# Patient Record
Sex: Female | Born: 1965 | Race: White | Hispanic: No | Marital: Married | Smoking: Never smoker
Health system: Southern US, Community
[De-identification: ages and names within clinical notes are randomized; demographics above are authoritative.]

## PROBLEM LIST (undated history)

## (undated) DIAGNOSIS — Z801 Family history of malignant neoplasm of trachea, bronchus and lung: Secondary | ICD-10-CM

## (undated) DIAGNOSIS — E079 Disorder of thyroid, unspecified: Secondary | ICD-10-CM

## (undated) DIAGNOSIS — R7303 Prediabetes: Secondary | ICD-10-CM

## (undated) DIAGNOSIS — E785 Hyperlipidemia, unspecified: Secondary | ICD-10-CM

## (undated) DIAGNOSIS — C50919 Malignant neoplasm of unspecified site of unspecified female breast: Secondary | ICD-10-CM

## (undated) DIAGNOSIS — R42 Dizziness and giddiness: Secondary | ICD-10-CM

## (undated) DIAGNOSIS — Z803 Family history of malignant neoplasm of breast: Secondary | ICD-10-CM

## (undated) DIAGNOSIS — C801 Malignant (primary) neoplasm, unspecified: Secondary | ICD-10-CM

## (undated) DIAGNOSIS — Z807 Family history of other malignant neoplasms of lymphoid, hematopoietic and related tissues: Secondary | ICD-10-CM

## (undated) DIAGNOSIS — G43909 Migraine, unspecified, not intractable, without status migrainosus: Secondary | ICD-10-CM

## (undated) DIAGNOSIS — Z9221 Personal history of antineoplastic chemotherapy: Secondary | ICD-10-CM

## (undated) DIAGNOSIS — Z923 Personal history of irradiation: Secondary | ICD-10-CM

## (undated) HISTORY — DX: Family history of other malignant neoplasms of lymphoid, hematopoietic and related tissues: Z80.7

## (undated) HISTORY — DX: Disorder of thyroid, unspecified: E07.9

## (undated) HISTORY — DX: Family history of malignant neoplasm of trachea, bronchus and lung: Z80.1

## (undated) HISTORY — PX: TONSILLECTOMY: SUR1361

## (undated) HISTORY — PX: NASAL SEPTUM SURGERY: SHX37

## (undated) HISTORY — DX: Prediabetes: R73.03

## (undated) HISTORY — PX: PORT-A-CATH REMOVAL: SHX5289

## (undated) HISTORY — DX: Hyperlipidemia, unspecified: E78.5

## (undated) HISTORY — PX: REDUCTION MAMMAPLASTY: SUR839

## (undated) HISTORY — DX: Migraine, unspecified, not intractable, without status migrainosus: G43.909

## (undated) HISTORY — PX: BREAST LUMPECTOMY: SHX2

## (undated) HISTORY — DX: Family history of malignant neoplasm of breast: Z80.3

## (undated) HISTORY — DX: Dizziness and giddiness: R42

## (undated) HISTORY — PX: BREAST SURGERY: SHX581

---

## 2004-02-26 ENCOUNTER — Other Ambulatory Visit: Admission: RE | Admit: 2004-02-26 | Discharge: 2004-02-26 | Payer: Self-pay | Admitting: Obstetrics and Gynecology

## 2004-03-09 ENCOUNTER — Encounter: Admission: RE | Admit: 2004-03-09 | Discharge: 2004-03-09 | Payer: Self-pay | Admitting: Obstetrics and Gynecology

## 2004-03-11 ENCOUNTER — Encounter: Admission: RE | Admit: 2004-03-11 | Discharge: 2004-03-11 | Payer: Self-pay | Admitting: Obstetrics and Gynecology

## 2004-09-06 ENCOUNTER — Encounter: Admission: RE | Admit: 2004-09-06 | Discharge: 2004-09-06 | Payer: Self-pay | Admitting: Obstetrics and Gynecology

## 2005-04-07 ENCOUNTER — Encounter: Admission: RE | Admit: 2005-04-07 | Discharge: 2005-04-07 | Payer: Self-pay | Admitting: Obstetrics and Gynecology

## 2005-04-07 ENCOUNTER — Other Ambulatory Visit: Admission: RE | Admit: 2005-04-07 | Discharge: 2005-04-07 | Payer: Self-pay | Admitting: Obstetrics and Gynecology

## 2006-05-04 ENCOUNTER — Encounter: Admission: RE | Admit: 2006-05-04 | Discharge: 2006-05-04 | Payer: Self-pay | Admitting: Obstetrics and Gynecology

## 2006-05-04 ENCOUNTER — Other Ambulatory Visit: Admission: RE | Admit: 2006-05-04 | Discharge: 2006-05-04 | Payer: Self-pay | Admitting: Obstetrics and Gynecology

## 2009-03-19 ENCOUNTER — Other Ambulatory Visit: Admission: RE | Admit: 2009-03-19 | Discharge: 2009-03-19 | Payer: Self-pay | Admitting: Obstetrics and Gynecology

## 2009-03-24 ENCOUNTER — Encounter: Admission: RE | Admit: 2009-03-24 | Discharge: 2009-03-24 | Payer: Self-pay | Admitting: Obstetrics and Gynecology

## 2009-03-30 ENCOUNTER — Encounter: Admission: RE | Admit: 2009-03-30 | Discharge: 2009-03-30 | Payer: Self-pay | Admitting: Obstetrics and Gynecology

## 2009-09-21 ENCOUNTER — Emergency Department: Payer: Self-pay | Admitting: Emergency Medicine

## 2010-02-28 ENCOUNTER — Encounter: Payer: Self-pay | Admitting: Internal Medicine

## 2010-05-17 ENCOUNTER — Other Ambulatory Visit: Admission: RE | Admit: 2010-05-17 | Discharge: 2010-05-17 | Payer: Self-pay | Admitting: Obstetrics and Gynecology

## 2010-05-25 ENCOUNTER — Encounter: Admission: RE | Admit: 2010-05-25 | Discharge: 2010-05-25 | Payer: Self-pay | Admitting: Obstetrics and Gynecology

## 2011-02-01 ENCOUNTER — Other Ambulatory Visit: Payer: Self-pay | Admitting: Obstetrics and Gynecology

## 2011-02-01 DIAGNOSIS — N6311 Unspecified lump in the right breast, upper outer quadrant: Secondary | ICD-10-CM

## 2011-02-06 ENCOUNTER — Ambulatory Visit
Admission: RE | Admit: 2011-02-06 | Discharge: 2011-02-06 | Disposition: A | Discharge status: Home / Self Care | Payer: BC Managed Care – PPO | Source: Ambulatory Visit | Attending: Obstetrics and Gynecology | Admitting: Obstetrics and Gynecology

## 2011-02-06 ENCOUNTER — Other Ambulatory Visit: Payer: Self-pay | Admitting: Obstetrics and Gynecology

## 2011-02-06 DIAGNOSIS — N6311 Unspecified lump in the right breast, upper outer quadrant: Secondary | ICD-10-CM

## 2011-02-06 DIAGNOSIS — N63 Unspecified lump in unspecified breast: Secondary | ICD-10-CM

## 2011-02-07 ENCOUNTER — Ambulatory Visit
Admission: RE | Admit: 2011-02-07 | Discharge: 2011-02-07 | Disposition: A | Discharge status: Home / Self Care | Payer: BC Managed Care – PPO | Source: Ambulatory Visit | Attending: Obstetrics and Gynecology | Admitting: Obstetrics and Gynecology

## 2011-02-07 ENCOUNTER — Other Ambulatory Visit: Payer: Self-pay | Admitting: Diagnostic Radiology

## 2011-02-07 ENCOUNTER — Other Ambulatory Visit: Payer: Self-pay | Admitting: Obstetrics and Gynecology

## 2011-02-07 DIAGNOSIS — N6311 Unspecified lump in the right breast, upper outer quadrant: Secondary | ICD-10-CM

## 2011-02-07 DIAGNOSIS — N6310 Unspecified lump in the right breast, unspecified quadrant: Secondary | ICD-10-CM

## 2011-02-07 DIAGNOSIS — N63 Unspecified lump in unspecified breast: Secondary | ICD-10-CM

## 2011-02-08 ENCOUNTER — Ambulatory Visit
Admission: RE | Admit: 2011-02-08 | Discharge: 2011-02-08 | Disposition: A | Discharge status: Home / Self Care | Payer: BC Managed Care – PPO | Source: Ambulatory Visit | Attending: Internal Medicine | Admitting: Internal Medicine

## 2011-02-08 ENCOUNTER — Other Ambulatory Visit: Payer: Self-pay | Admitting: Internal Medicine

## 2011-02-08 ENCOUNTER — Ambulatory Visit
Admission: RE | Admit: 2011-02-08 | Discharge: 2011-02-08 | Disposition: A | Discharge status: Home / Self Care | Payer: BC Managed Care – PPO | Source: Ambulatory Visit | Attending: Obstetrics and Gynecology | Admitting: Obstetrics and Gynecology

## 2011-02-08 DIAGNOSIS — N63 Unspecified lump in unspecified breast: Secondary | ICD-10-CM

## 2011-02-08 DIAGNOSIS — N6310 Unspecified lump in the right breast, unspecified quadrant: Secondary | ICD-10-CM

## 2011-02-08 DIAGNOSIS — C50911 Malignant neoplasm of unspecified site of right female breast: Secondary | ICD-10-CM

## 2011-02-09 ENCOUNTER — Other Ambulatory Visit: Payer: Self-pay | Admitting: Obstetrics and Gynecology

## 2011-02-09 DIAGNOSIS — C50911 Malignant neoplasm of unspecified site of right female breast: Secondary | ICD-10-CM

## 2011-02-10 ENCOUNTER — Other Ambulatory Visit: Payer: BC Managed Care – PPO

## 2011-02-10 ENCOUNTER — Ambulatory Visit
Admission: RE | Admit: 2011-02-10 | Discharge: 2011-02-10 | Disposition: A | Discharge status: Home / Self Care | Payer: BC Managed Care – PPO | Source: Ambulatory Visit | Attending: Internal Medicine | Admitting: Internal Medicine

## 2011-02-10 DIAGNOSIS — C50911 Malignant neoplasm of unspecified site of right female breast: Secondary | ICD-10-CM

## 2011-02-10 MED ORDER — GADOBENATE DIMEGLUMINE 529 MG/ML IV SOLN
15.0000 mL | Freq: Once | INTRAVENOUS | Status: AC | PRN
  Administered 2011-02-10: 15 mL via INTRAVENOUS

## 2011-02-15 ENCOUNTER — Other Ambulatory Visit: Payer: Self-pay | Admitting: Oncology

## 2011-02-15 ENCOUNTER — Encounter (HOSPITAL_BASED_OUTPATIENT_CLINIC_OR_DEPARTMENT_OTHER): Payer: BC Managed Care – PPO | Admitting: Oncology

## 2011-02-15 DIAGNOSIS — C50419 Malignant neoplasm of upper-outer quadrant of unspecified female breast: Secondary | ICD-10-CM

## 2011-02-15 DIAGNOSIS — C50919 Malignant neoplasm of unspecified site of unspecified female breast: Secondary | ICD-10-CM

## 2011-02-15 LAB — COMPREHENSIVE METABOLIC PANEL
Albumin: 4 g/dL (ref 3.5–5.2)
BUN: 11 mg/dL (ref 6–23)
CO2: 28 mEq/L (ref 19–32)
Glucose, Bld: 93 mg/dL (ref 70–99)
Sodium: 139 mEq/L (ref 135–145)
Total Bilirubin: 0.2 mg/dL — ABNORMAL LOW (ref 0.3–1.2)
Total Protein: 6.5 g/dL (ref 6.0–8.3)

## 2011-02-15 LAB — CBC WITH DIFFERENTIAL/PLATELET
Basophils Absolute: 0 10*3/uL (ref 0.0–0.1)
Eosinophils Absolute: 0.1 10*3/uL (ref 0.0–0.5)
HCT: 39.7 % (ref 34.8–46.6)
HGB: 13.4 g/dL (ref 11.6–15.9)
LYMPH%: 34 % (ref 14.0–49.7)
MCV: 90.2 fL (ref 79.5–101.0)
MONO#: 0.3 10*3/uL (ref 0.1–0.9)
NEUT#: 2.7 10*3/uL (ref 1.5–6.5)
Platelets: 180 10*3/uL (ref 145–400)
RBC: 4.4 10*6/uL (ref 3.70–5.45)
WBC: 4.8 10*3/uL (ref 3.9–10.3)

## 2011-02-15 LAB — CANCER ANTIGEN 27.29: CA 27.29: 18 U/mL (ref 0–39)

## 2011-02-17 ENCOUNTER — Encounter (HOSPITAL_COMMUNITY): Payer: Self-pay

## 2011-02-17 ENCOUNTER — Encounter (HOSPITAL_COMMUNITY)
Admission: RE | Admit: 2011-02-17 | Discharge: 2011-02-17 | Disposition: A | Discharge status: Home / Self Care | Payer: BC Managed Care – PPO | Source: Ambulatory Visit | Attending: Oncology | Admitting: Oncology

## 2011-02-17 DIAGNOSIS — C50919 Malignant neoplasm of unspecified site of unspecified female breast: Secondary | ICD-10-CM

## 2011-02-17 DIAGNOSIS — N831 Corpus luteum cyst of ovary, unspecified side: Secondary | ICD-10-CM | POA: Insufficient documentation

## 2011-02-17 DIAGNOSIS — Z79899 Other long term (current) drug therapy: Secondary | ICD-10-CM | POA: Insufficient documentation

## 2011-02-17 HISTORY — DX: Malignant (primary) neoplasm, unspecified: C80.1

## 2011-02-17 LAB — GLUCOSE, CAPILLARY: Glucose-Capillary: 97 mg/dL (ref 70–99)

## 2011-02-17 MED ORDER — FLUDEOXYGLUCOSE F - 18 (FDG) INJECTION
17.6000 | Freq: Once | INTRAVENOUS | Status: AC | PRN
  Administered 2011-02-17: 17.6 via INTRAVENOUS

## 2011-02-17 MED ORDER — IOHEXOL 300 MG/ML  SOLN
100.0000 mL | Freq: Once | INTRAMUSCULAR | Status: AC | PRN
  Administered 2011-02-17: 100 mL via INTRAVENOUS

## 2011-02-20 ENCOUNTER — Other Ambulatory Visit (INDEPENDENT_AMBULATORY_CARE_PROVIDER_SITE_OTHER): Payer: Self-pay | Admitting: General Surgery

## 2011-02-20 ENCOUNTER — Encounter: Payer: BC Managed Care – PPO | Admitting: Genetic Counselor

## 2011-02-20 DIAGNOSIS — C50911 Malignant neoplasm of unspecified site of right female breast: Secondary | ICD-10-CM

## 2011-02-22 ENCOUNTER — Ambulatory Visit (HOSPITAL_COMMUNITY)
Admission: RE | Admit: 2011-02-22 | Discharge: 2011-02-22 | Disposition: A | Discharge status: Home / Self Care | Payer: BC Managed Care – PPO | Source: Ambulatory Visit | Attending: Oncology | Admitting: Oncology

## 2011-02-22 DIAGNOSIS — I428 Other cardiomyopathies: Secondary | ICD-10-CM | POA: Insufficient documentation

## 2011-02-22 DIAGNOSIS — C50919 Malignant neoplasm of unspecified site of unspecified female breast: Secondary | ICD-10-CM | POA: Insufficient documentation

## 2011-02-22 DIAGNOSIS — Z5111 Encounter for antineoplastic chemotherapy: Secondary | ICD-10-CM

## 2011-02-28 ENCOUNTER — Ambulatory Visit (INDEPENDENT_AMBULATORY_CARE_PROVIDER_SITE_OTHER): Payer: BC Managed Care – PPO | Admitting: Psychiatry

## 2011-02-28 DIAGNOSIS — F0630 Mood disorder due to known physiological condition, unspecified: Secondary | ICD-10-CM

## 2011-03-02 ENCOUNTER — Ambulatory Visit (HOSPITAL_COMMUNITY): Payer: BC Managed Care – PPO

## 2011-03-02 ENCOUNTER — Ambulatory Visit
Admit: 2011-03-02 | Discharge: 2011-03-02 | Disposition: A | Discharge status: Home / Self Care | Payer: BC Managed Care – PPO | Attending: General Surgery | Admitting: General Surgery

## 2011-03-02 ENCOUNTER — Ambulatory Visit
Admission: RE | Admit: 2011-03-02 | Discharge: 2011-03-02 | Disposition: A | Discharge status: Home / Self Care | Payer: BC Managed Care – PPO | Source: Ambulatory Visit | Attending: General Surgery | Admitting: General Surgery

## 2011-03-02 ENCOUNTER — Ambulatory Visit (HOSPITAL_BASED_OUTPATIENT_CLINIC_OR_DEPARTMENT_OTHER)
Admission: RE | Admit: 2011-03-02 | Discharge: 2011-03-02 | Disposition: A | Discharge status: Home / Self Care | Payer: BC Managed Care – PPO | Source: Ambulatory Visit | Attending: General Surgery | Admitting: General Surgery

## 2011-03-02 ENCOUNTER — Other Ambulatory Visit (INDEPENDENT_AMBULATORY_CARE_PROVIDER_SITE_OTHER): Payer: Self-pay | Admitting: General Surgery

## 2011-03-02 DIAGNOSIS — C50911 Malignant neoplasm of unspecified site of right female breast: Secondary | ICD-10-CM

## 2011-03-02 DIAGNOSIS — C50919 Malignant neoplasm of unspecified site of unspecified female breast: Secondary | ICD-10-CM | POA: Insufficient documentation

## 2011-03-02 DIAGNOSIS — F341 Dysthymic disorder: Secondary | ICD-10-CM | POA: Insufficient documentation

## 2011-03-02 DIAGNOSIS — Z01812 Encounter for preprocedural laboratory examination: Secondary | ICD-10-CM | POA: Insufficient documentation

## 2011-03-02 LAB — POCT HEMOGLOBIN-HEMACUE: Hemoglobin: 13.5 g/dL (ref 12.0–15.0)

## 2011-03-02 MED ORDER — TECHNETIUM TC 99M SULFUR COLLOID FILTERED
1.0000 | Freq: Once | INTRAVENOUS | Status: AC | PRN
  Administered 2011-03-02: 1 via INTRADERMAL

## 2011-03-06 NOTE — Op Note (Signed)
NAME:  Renee Mcdaniel, SHARPNACK NO.:  [id number]  MEDICAL RECORD NO.:  [id number]  LOCATION:                                 FACILITY:  PHYSICIAN:  [name], MDDATE OF BIRTH:  [birth year]  DATE OF PROCEDURE:  03/02/2011 DATE OF DISCHARGE:                              OPERATIVE REPORT   PREOPERATIVE DIAGNOSIS:  Stage I right breast cancer, triple negative.  POSTOPERATIVE DIAGNOSIS:  Stage I right breast cancer, triple negative.  PROCEDURE: 1. Right breast wire-guided lumpectomy. 2. Injection of methylene blue dye for sentinel node identification. 3. Right axillary sentinel node biopsy. 4. Left subclavian 8-French power port insertion.  SURGEON:  Troy Sine. Dwain Sarna, MD  ASSISTANT:  None.  ANESTHESIA:  General.  SPECIMENS: 1. Right breast marked with paint kit. 2. Right axillary sentinel node x2.  DISPOSITION OF SPECIMENS:  Pathology.  ESTIMATED BLOOD LOSS:  Minimal.  COMPLICATIONS:  None. DRAINS:  None.  DISPOSITION:  The patient to PACU in stable condition.  INDICATIONS:  This is a 45 year old female with a newly diagnosed T2 right breast cancer with a benign intramammary lymph node who underwent evaluation with negative genetic testing.  She had a mildly enlarged node on her MRI, but this was not clinically palpable.  We discussed all of her options, and we have decided to do a right breast wire-guided lumpectomy with excision of the intramammary node as near the tumor as well as an axillary sentinel node biopsy with a possible completion axillary dissection for intraoperative pathology is positive combined with the Port-A-Cath due to the fact that she is triple negative, and this will be her primary systemic adjuvant therapy.  PROCEDURE:  After informed consent was obtained, the patient was first taken to the breast center.  She had two wires placed by Dr. Juel Burrow.  I discussed these prior to her having the wires placed, one was  an intramammary node, one was in the tumor.  She was then brought to [facility] surgery.  She was injected with technetium in a standard periareolar fashion.  She was then taken to the operating room.  She was administered 1 g of intravenous cefazolin.  Sequential compression devices were placed on lower extremities prior to induction with anesthesia.  She was then placed under general anesthesia without complication. A surgical time-out was then performed.  I then injected 1 mL of methylene blue saline mixture in each quadrant around the nipple, massaged if for 2 minutes.  She was then prepped and draped in the standard sterile surgical fashion.  I then made an axillary incision to approach the sentinel node first.  I dissected down.  I opened the axillary fascia.  I then was able to identify a node that appeared to be the 1.3 cm node that was seen on the MRI.  This was excised with a count of 289.  There was another packet of some nodes with an activity of about 100-120 that was also excised.  These were all passed off the table for intraoperative pathology.  Dr. Jimmy Picket confirmed that these were negative.  The background radioactivity was 0 following this.  There was no blue dye, no  other palpable nodes present. I packed this with a sponge.  I then approached the breast.  I made a radial incision in the upper outer quadrant, used cautery to excise the both wires, both lesions all the way down to the pectoralis muscle.  The deep margin is the pectoralis muscle, and the fascia was taken with this.  A portion of this was overlying the chest wall deep as well. There was really not any additional breast tissue in this location or lateral.  This was then passed off the table after being marked with a paint kit.  Mammogram confirmed removal of both lesions, both clips, and both wires, and appeared on mammogram the margins were all clear.  This was confirmed by Dr. Juel Burrow.  I then mobilized  the breast tissue.  I placed two clips deep on the pectoralis muscle and one clip in each cardinal position.  I then closed the deep breast tissue with a 2-0 Vicryl and then several layers with 3-0 Vicryl and 4-0 Monocryl.  The axillary incision was closed with 2-0 Vicryl for the axillary fascia, 3-0 Vicryl, and 4-0 Monocryl.  Dermabond and Steri-Strips were placed on all these incisions.  We then broke down this set completely, changed gowns, and proceeded to performing the Port-A-Cath placement on the left side.  She was tucked and appropriately padded.  She was then prepped and draped in a standard sterile surgical fashion.  Again, it was somewhat difficult for me to access her left subclavian vein and took several passes. Eventually, I was able to access her subclavian vein and this site, and placed the wire.  This was confirmed to be in position by fluoroscopy. I then made a pocket below this overlying the pectoralis fascia.  I then tunneled the line between the two,  the peel-away sheath was placed along with the dilator under direct vision.  The wire assembly was removed, the line was passed through, and the peel-away sheath was then removed.  The line was then pulled back to be near the cavoatrial junction.  This appeared to be in good position.  It was attached to the port, and this was secured in position with 2-0 Prolene and three places.  This flushed and aspirated blood.  It was packed with concentrated heparin.  This was closed with 3-0 Vicryl and 4- 0 Monocryl.  This was a 8-French power port.  Then, Dermabond was placed over this.  She tolerated all of this well, was extubated in the operating room, and transferred to the recovery room in stable condition.     [name], MD     MCW/MEDQ  D:  03/02/2011  T:  03/03/2011  Job:  [id number]  cc:   Pierce Crane, M.D., F.R.C.P.C. Lurline Hare, M.D. Daniel Nones, MD Jimmy Picket, MD  Electronically Signed by  Emelia Loron MD on 03/06/2011 04:21:47 PM

## 2011-03-08 ENCOUNTER — Ambulatory Visit
Admission: RE | Admit: 2011-03-08 | Discharge: 2011-03-08 | Disposition: A | Discharge status: Home / Self Care | Payer: BC Managed Care – PPO | Source: Ambulatory Visit | Attending: Radiation Oncology | Admitting: Radiation Oncology

## 2011-03-08 ENCOUNTER — Encounter (HOSPITAL_BASED_OUTPATIENT_CLINIC_OR_DEPARTMENT_OTHER): Payer: BC Managed Care – PPO | Admitting: Oncology

## 2011-03-08 DIAGNOSIS — C50419 Malignant neoplasm of upper-outer quadrant of unspecified female breast: Secondary | ICD-10-CM | POA: Insufficient documentation

## 2011-03-20 ENCOUNTER — Encounter: Payer: BC Managed Care – PPO | Admitting: Oncology

## 2011-03-24 ENCOUNTER — Ambulatory Visit (INDEPENDENT_AMBULATORY_CARE_PROVIDER_SITE_OTHER): Payer: BC Managed Care – PPO | Admitting: General Surgery

## 2011-03-24 ENCOUNTER — Encounter (INDEPENDENT_AMBULATORY_CARE_PROVIDER_SITE_OTHER): Payer: Self-pay | Admitting: General Surgery

## 2011-03-24 DIAGNOSIS — C50919 Malignant neoplasm of unspecified site of unspecified female breast: Secondary | ICD-10-CM

## 2011-03-24 DIAGNOSIS — C50411 Malignant neoplasm of upper-outer quadrant of right female breast: Secondary | ICD-10-CM | POA: Insufficient documentation

## 2011-03-24 DIAGNOSIS — Z09 Encounter for follow-up examination after completed treatment for conditions other than malignant neoplasm: Secondary | ICD-10-CM

## 2011-03-24 NOTE — Progress Notes (Signed)
Subjective:     Patient ID: Renee Mcdaniel, female   DOB: 08-15-1966, 45 y.o.   MRN: [id number]    There were no vitals taken for this visit.    HPI This is a 45 year old female who I initially met in May of 2012 at the multidisciplinary breast cancer conference. We discussed multiple options to take care of her newly diagnosed right breast cancer. We decided to perform a lumpectomy and excise an intramammary lymph node that was next to it as well as a sentinel lymph node biopsy. We also decided to place a porta same time given her triple negative status. Her genetic testing was negative. She underwent a lumpectomy, sentinel node biopsy, and left subclavian port placement. Her pathology returns as a T1 C. N0 stage I triple negative infiltrating ductal carcinoma. The specimen mammogram showed presence of both clips and both masses to be in the specimen. She comes back in today doing well without any significant complaints.  Review of Systems     Objective:   Physical Exam Well healing right upper outer quadrant breast incision and right axillary incision    Assessment:     Triple negative right breast stage I invasive ductal cancer s/p lumpectomy with negative margins and negative sentinel nodes    Plan:        She is doing really well after surgery. I released her to full activity. She due to begin her chemotherapy with Dr. Donnie Coffin on the 19th of this month. Following that she will need to undergo radiation therapy. I told her that I could try to remove her port between chemotherapy and radiation therapy of Dr. Donnie Coffin was agreeable to that. Otherwise I will plan on seeing her mother 2 after she finishes her radiation therapy.

## 2011-04-06 ENCOUNTER — Encounter (HOSPITAL_BASED_OUTPATIENT_CLINIC_OR_DEPARTMENT_OTHER): Payer: BC Managed Care – PPO | Admitting: Oncology

## 2011-04-06 ENCOUNTER — Other Ambulatory Visit: Payer: Self-pay | Admitting: Oncology

## 2011-04-06 DIAGNOSIS — Z5111 Encounter for antineoplastic chemotherapy: Secondary | ICD-10-CM

## 2011-04-06 DIAGNOSIS — C50419 Malignant neoplasm of upper-outer quadrant of unspecified female breast: Secondary | ICD-10-CM

## 2011-04-06 LAB — CBC WITH DIFFERENTIAL/PLATELET
BASO%: 0.3 % (ref 0.0–2.0)
LYMPH%: 25.3 % (ref 14.0–49.7)
MCHC: 34.1 g/dL (ref 31.5–36.0)
MONO#: 0.6 10*3/uL (ref 0.1–0.9)
MONO%: 7.3 % (ref 0.0–14.0)
Platelets: 190 10*3/uL (ref 145–400)
RBC: 4.36 10*6/uL (ref 3.70–5.45)
RDW: 12.9 % (ref 11.2–14.5)
WBC: 7.6 10*3/uL (ref 3.9–10.3)
nRBC: 0 % (ref 0–0)

## 2011-04-07 ENCOUNTER — Encounter (HOSPITAL_BASED_OUTPATIENT_CLINIC_OR_DEPARTMENT_OTHER): Payer: BC Managed Care – PPO | Admitting: Oncology

## 2011-04-07 DIAGNOSIS — Z5189 Encounter for other specified aftercare: Secondary | ICD-10-CM

## 2011-04-07 DIAGNOSIS — C50419 Malignant neoplasm of upper-outer quadrant of unspecified female breast: Secondary | ICD-10-CM

## 2011-04-12 ENCOUNTER — Other Ambulatory Visit: Payer: Self-pay | Admitting: Oncology

## 2011-04-12 ENCOUNTER — Encounter (HOSPITAL_BASED_OUTPATIENT_CLINIC_OR_DEPARTMENT_OTHER): Payer: BC Managed Care – PPO | Admitting: Oncology

## 2011-04-12 DIAGNOSIS — C50419 Malignant neoplasm of upper-outer quadrant of unspecified female breast: Secondary | ICD-10-CM

## 2011-04-12 LAB — CBC WITH DIFFERENTIAL/PLATELET
BASO%: 2.1 % — ABNORMAL HIGH (ref 0.0–2.0)
EOS%: 2.4 % (ref 0.0–7.0)
HGB: 12.6 g/dL (ref 11.6–15.9)
MCH: 29.4 pg (ref 25.1–34.0)
MCHC: 33.3 g/dL (ref 31.5–36.0)
MONO#: 0.7 10*3/uL (ref 0.1–0.9)
RDW: 13 % (ref 11.2–14.5)
WBC: 2.9 10*3/uL — ABNORMAL LOW (ref 3.9–10.3)
lymph#: 1.3 10*3/uL (ref 0.9–3.3)

## 2011-04-12 LAB — BASIC METABOLIC PANEL
Chloride: 102 mEq/L (ref 96–112)
Potassium: 4.4 mEq/L (ref 3.5–5.3)

## 2011-04-27 ENCOUNTER — Other Ambulatory Visit: Payer: Self-pay | Admitting: Physician Assistant

## 2011-04-27 ENCOUNTER — Encounter (HOSPITAL_BASED_OUTPATIENT_CLINIC_OR_DEPARTMENT_OTHER): Payer: BC Managed Care – PPO | Admitting: Oncology

## 2011-04-27 ENCOUNTER — Other Ambulatory Visit: Payer: Self-pay | Admitting: Oncology

## 2011-04-27 DIAGNOSIS — C50419 Malignant neoplasm of upper-outer quadrant of unspecified female breast: Secondary | ICD-10-CM

## 2011-04-27 DIAGNOSIS — Z5111 Encounter for antineoplastic chemotherapy: Secondary | ICD-10-CM

## 2011-04-27 LAB — COMPREHENSIVE METABOLIC PANEL
CO2: 25 mEq/L (ref 19–32)
Creatinine, Ser: 0.83 mg/dL (ref 0.50–1.10)
Glucose, Bld: 103 mg/dL — ABNORMAL HIGH (ref 70–99)
Sodium: 139 mEq/L (ref 135–145)
Total Bilirubin: 0.3 mg/dL (ref 0.3–1.2)
Total Protein: 5.9 g/dL — ABNORMAL LOW (ref 6.0–8.3)

## 2011-04-27 LAB — CBC WITH DIFFERENTIAL/PLATELET
Basophils Absolute: 0 10*3/uL (ref 0.0–0.1)
Eosinophils Absolute: 0 10*3/uL (ref 0.0–0.5)
HCT: 36.6 % (ref 34.8–46.6)
HGB: 12.1 g/dL (ref 11.6–15.9)
LYMPH%: 17.2 % (ref 14.0–49.7)
MCV: 90.1 fL (ref 79.5–101.0)
MONO%: 6.7 % (ref 0.0–14.0)
NEUT#: 7.2 10*3/uL — ABNORMAL HIGH (ref 1.5–6.5)
NEUT%: 75.8 % (ref 38.4–76.8)
Platelets: 318 10*3/uL (ref 145–400)

## 2011-04-28 ENCOUNTER — Encounter (HOSPITAL_BASED_OUTPATIENT_CLINIC_OR_DEPARTMENT_OTHER): Payer: BC Managed Care – PPO | Admitting: Oncology

## 2011-04-28 DIAGNOSIS — C50419 Malignant neoplasm of upper-outer quadrant of unspecified female breast: Secondary | ICD-10-CM

## 2011-04-28 DIAGNOSIS — Z5189 Encounter for other specified aftercare: Secondary | ICD-10-CM

## 2011-05-04 ENCOUNTER — Other Ambulatory Visit: Payer: Self-pay | Admitting: Physician Assistant

## 2011-05-04 ENCOUNTER — Encounter (HOSPITAL_BASED_OUTPATIENT_CLINIC_OR_DEPARTMENT_OTHER): Payer: BC Managed Care – PPO | Admitting: Oncology

## 2011-05-04 DIAGNOSIS — C50419 Malignant neoplasm of upper-outer quadrant of unspecified female breast: Secondary | ICD-10-CM

## 2011-05-04 LAB — CBC WITH DIFFERENTIAL/PLATELET
Eosinophils Absolute: 0.1 10*3/uL (ref 0.0–0.5)
LYMPH%: 30.4 % (ref 14.0–49.7)
MONO#: 0.9 10*3/uL (ref 0.1–0.9)
NEUT#: 2.3 10*3/uL (ref 1.5–6.5)
Platelets: 160 10*3/uL (ref 145–400)
RBC: 3.83 10*6/uL (ref 3.70–5.45)
RDW: 13.9 % (ref 11.2–14.5)
WBC: 4.8 10*3/uL (ref 3.9–10.3)

## 2011-05-18 ENCOUNTER — Other Ambulatory Visit: Payer: Self-pay | Admitting: Physician Assistant

## 2011-05-18 ENCOUNTER — Other Ambulatory Visit: Payer: Self-pay | Admitting: Oncology

## 2011-05-18 ENCOUNTER — Encounter (HOSPITAL_BASED_OUTPATIENT_CLINIC_OR_DEPARTMENT_OTHER): Payer: BC Managed Care – PPO | Admitting: Oncology

## 2011-05-18 DIAGNOSIS — Z5111 Encounter for antineoplastic chemotherapy: Secondary | ICD-10-CM

## 2011-05-18 DIAGNOSIS — C50419 Malignant neoplasm of upper-outer quadrant of unspecified female breast: Secondary | ICD-10-CM

## 2011-05-18 LAB — COMPREHENSIVE METABOLIC PANEL
CO2: 24 mEq/L (ref 19–32)
Calcium: 9.4 mg/dL (ref 8.4–10.5)
Creatinine, Ser: 0.75 mg/dL (ref 0.50–1.10)
Glucose, Bld: 111 mg/dL — ABNORMAL HIGH (ref 70–99)
Total Bilirubin: 0.3 mg/dL (ref 0.3–1.2)

## 2011-05-18 LAB — CBC WITH DIFFERENTIAL/PLATELET
Basophils Absolute: 0 10*3/uL (ref 0.0–0.1)
Eosinophils Absolute: 0 10*3/uL (ref 0.0–0.5)
HCT: 35 % (ref 34.8–46.6)
HGB: 11.6 g/dL (ref 11.6–15.9)
LYMPH%: 10.1 % — ABNORMAL LOW (ref 14.0–49.7)
MONO#: 0.2 10*3/uL (ref 0.1–0.9)
NEUT#: 6.5 10*3/uL (ref 1.5–6.5)
NEUT%: 86.7 % — ABNORMAL HIGH (ref 38.4–76.8)
Platelets: 195 10*3/uL (ref 145–400)
WBC: 7.5 10*3/uL (ref 3.9–10.3)

## 2011-05-19 ENCOUNTER — Encounter (HOSPITAL_BASED_OUTPATIENT_CLINIC_OR_DEPARTMENT_OTHER): Payer: BC Managed Care – PPO | Admitting: Oncology

## 2011-05-19 DIAGNOSIS — C50419 Malignant neoplasm of upper-outer quadrant of unspecified female breast: Secondary | ICD-10-CM

## 2011-05-19 DIAGNOSIS — Z5189 Encounter for other specified aftercare: Secondary | ICD-10-CM

## 2011-05-25 ENCOUNTER — Encounter (HOSPITAL_BASED_OUTPATIENT_CLINIC_OR_DEPARTMENT_OTHER): Payer: BC Managed Care – PPO | Admitting: Oncology

## 2011-05-25 ENCOUNTER — Other Ambulatory Visit: Payer: Self-pay | Admitting: Physician Assistant

## 2011-05-25 DIAGNOSIS — C50419 Malignant neoplasm of upper-outer quadrant of unspecified female breast: Secondary | ICD-10-CM

## 2011-05-25 LAB — CBC WITH DIFFERENTIAL/PLATELET
BASO%: 0.5 % (ref 0.0–2.0)
Eosinophils Absolute: 0.1 10*3/uL (ref 0.0–0.5)
HCT: 31 % — ABNORMAL LOW (ref 34.8–46.6)
LYMPH%: 21.5 % (ref 14.0–49.7)
MCHC: 34.3 g/dL (ref 31.5–36.0)
MCV: 90.5 fL (ref 79.5–101.0)
MONO#: 1.2 10*3/uL — ABNORMAL HIGH (ref 0.1–0.9)
MONO%: 16.5 % — ABNORMAL HIGH (ref 0.0–14.0)
NEUT%: 60.8 % (ref 38.4–76.8)
Platelets: 149 10*3/uL (ref 145–400)
RBC: 3.42 10*6/uL — ABNORMAL LOW (ref 3.70–5.45)
WBC: 7.1 10*3/uL (ref 3.9–10.3)

## 2011-05-30 ENCOUNTER — Other Ambulatory Visit: Payer: Self-pay | Admitting: Oncology

## 2011-05-30 DIAGNOSIS — C50919 Malignant neoplasm of unspecified site of unspecified female breast: Secondary | ICD-10-CM

## 2011-06-05 ENCOUNTER — Ambulatory Visit
Admission: RE | Admit: 2011-06-05 | Discharge: 2011-06-05 | Disposition: A | Discharge status: Home / Self Care | Payer: BC Managed Care – PPO | Source: Ambulatory Visit | Attending: Oncology | Admitting: Oncology

## 2011-06-05 DIAGNOSIS — C50919 Malignant neoplasm of unspecified site of unspecified female breast: Secondary | ICD-10-CM

## 2011-06-08 ENCOUNTER — Other Ambulatory Visit: Payer: Self-pay | Admitting: Oncology

## 2011-06-08 ENCOUNTER — Encounter (HOSPITAL_BASED_OUTPATIENT_CLINIC_OR_DEPARTMENT_OTHER): Payer: BC Managed Care – PPO | Admitting: Oncology

## 2011-06-08 DIAGNOSIS — C50419 Malignant neoplasm of upper-outer quadrant of unspecified female breast: Secondary | ICD-10-CM

## 2011-06-08 DIAGNOSIS — Z5111 Encounter for antineoplastic chemotherapy: Secondary | ICD-10-CM

## 2011-06-08 LAB — CBC WITH DIFFERENTIAL/PLATELET
BASO%: 0.2 % (ref 0.0–2.0)
EOS%: 0 % (ref 0.0–7.0)
HCT: 34.8 % (ref 34.8–46.6)
LYMPH%: 12.9 % — ABNORMAL LOW (ref 14.0–49.7)
MCH: 30.3 pg (ref 25.1–34.0)
MCHC: 33 g/dL (ref 31.5–36.0)
NEUT%: 84.4 % — ABNORMAL HIGH (ref 38.4–76.8)
Platelets: 246 10*3/uL (ref 145–400)
RBC: 3.8 10*6/uL (ref 3.70–5.45)

## 2011-06-08 LAB — COMPREHENSIVE METABOLIC PANEL
ALT: 23 U/L (ref 0–35)
AST: 17 U/L (ref 0–37)
Alkaline Phosphatase: 81 U/L (ref 39–117)
Creatinine, Ser: 0.76 mg/dL (ref 0.50–1.10)
Sodium: 139 mEq/L (ref 135–145)
Total Bilirubin: 0.3 mg/dL (ref 0.3–1.2)

## 2011-06-09 ENCOUNTER — Encounter (HOSPITAL_BASED_OUTPATIENT_CLINIC_OR_DEPARTMENT_OTHER): Payer: BC Managed Care – PPO | Admitting: Oncology

## 2011-06-09 DIAGNOSIS — Z5189 Encounter for other specified aftercare: Secondary | ICD-10-CM

## 2011-06-09 DIAGNOSIS — C50419 Malignant neoplasm of upper-outer quadrant of unspecified female breast: Secondary | ICD-10-CM

## 2011-06-28 ENCOUNTER — Encounter: Payer: BC Managed Care – PPO | Admitting: Oncology

## 2011-06-28 ENCOUNTER — Ambulatory Visit
Admission: RE | Admit: 2011-06-28 | Discharge: 2011-06-28 | Disposition: A | Discharge status: Home / Self Care | Payer: BC Managed Care – PPO | Source: Ambulatory Visit | Attending: Radiation Oncology | Admitting: Radiation Oncology

## 2011-06-28 ENCOUNTER — Ambulatory Visit: Payer: BC Managed Care – PPO | Admitting: Radiation Oncology

## 2011-06-28 DIAGNOSIS — C50419 Malignant neoplasm of upper-outer quadrant of unspecified female breast: Secondary | ICD-10-CM | POA: Insufficient documentation

## 2011-06-28 DIAGNOSIS — R0789 Other chest pain: Secondary | ICD-10-CM | POA: Insufficient documentation

## 2011-06-28 DIAGNOSIS — Z510 Encounter for antineoplastic radiation therapy: Secondary | ICD-10-CM | POA: Insufficient documentation

## 2011-06-28 DIAGNOSIS — M7989 Other specified soft tissue disorders: Secondary | ICD-10-CM | POA: Insufficient documentation

## 2011-06-28 LAB — BASIC METABOLIC PANEL
BUN: 14 mg/dL (ref 6–23)
CO2: 31 mEq/L (ref 19–32)
Calcium: 8.8 mg/dL (ref 8.4–10.5)
Creatinine, Ser: 0.76 mg/dL (ref 0.50–1.10)
Glucose, Bld: 98 mg/dL (ref 70–99)

## 2011-06-29 ENCOUNTER — Ambulatory Visit (HOSPITAL_BASED_OUTPATIENT_CLINIC_OR_DEPARTMENT_OTHER)
Admission: RE | Admit: 2011-06-29 | Discharge: 2011-06-29 | Disposition: A | Discharge status: Home / Self Care | Payer: BC Managed Care – PPO | Source: Ambulatory Visit | Attending: General Surgery | Admitting: General Surgery

## 2011-06-29 ENCOUNTER — Other Ambulatory Visit (INDEPENDENT_AMBULATORY_CARE_PROVIDER_SITE_OTHER): Payer: Self-pay | Admitting: General Surgery

## 2011-06-29 DIAGNOSIS — C50919 Malignant neoplasm of unspecified site of unspecified female breast: Secondary | ICD-10-CM | POA: Insufficient documentation

## 2011-06-29 DIAGNOSIS — Z853 Personal history of malignant neoplasm of breast: Secondary | ICD-10-CM

## 2011-06-29 DIAGNOSIS — Z452 Encounter for adjustment and management of vascular access device: Secondary | ICD-10-CM | POA: Insufficient documentation

## 2011-06-30 ENCOUNTER — Ambulatory Visit (HOSPITAL_COMMUNITY): Admission: RE | Admit: 2011-06-30 | Payer: BC Managed Care – PPO | Source: Ambulatory Visit | Admitting: General Surgery

## 2011-07-03 NOTE — Op Note (Signed)
  NAME:  Renee Mcdaniel, Renee Mcdaniel                ACCOUNT NO.:  [id number]  MEDICAL RECORD NO.:  [id number]  LOCATION:                                 FACILITY:  PHYSICIAN:  [name], MDDATE OF BIRTH:  [birth year]  DATE OF PROCEDURE:  06/29/2011 DATE OF DISCHARGE:                              OPERATIVE REPORT   PREOPERATIVE DIAGNOSIS:  Breast cancer, status post treatment.  POSTOPERATIVE DIAGNOSIS:  Breast cancer, status post treatment.  PROCEDURE:  Port removal.  SURGEON:  [name], MD.  ASSISTANT:  None.  ANESTHESIA:  Local MAC.  SPECIMENS:  Port.  ESTIMATED BLOOD LOSS:  Minimal.  COMPLICATIONS:  None.  DRAINS:  None.  DISPOSITION:  Recovery room in stable condition.  INDICATIONS:  This is a 45 year old female who has completed her treatment for breast cancer, who now presents for port removal.  We discussed the risks and benefits associated with that.  DESCRIPTION OF PROCEDURE:  After informed consent was obtained, the patient was taken to the operating room.  She was placed under monitored anesthesia care.  She was then prepped and draped in a standard sterile surgical fashion.  Surgical time-out was then performed.  I then infiltrated with 0.25% Marcaine throughout the area of her port. I then made an incision.  I removed the port in its entirety.  The hemostasis was observed.  I removed all the stitch material.  This was then closed with 3-0 Vicryl, 4-0 Monocryl, and Dermabond.  She tolerated this well, was transferred to recovery room in stable condition.     [name], MD     MCW/MEDQ  D:  06/29/2011  T:  06/29/2011  Job:  [id number]  cc:   Pierce Crane, M.D., F.R.C.P.C. Lurline Hare, M.D.  Electronically Signed by Emelia Loron MD on 07/03/2011 07:57:52 PM

## 2011-07-04 ENCOUNTER — Other Ambulatory Visit: Payer: Self-pay | Admitting: Oncology

## 2011-07-04 ENCOUNTER — Encounter (HOSPITAL_BASED_OUTPATIENT_CLINIC_OR_DEPARTMENT_OTHER): Payer: BC Managed Care – PPO | Admitting: Oncology

## 2011-07-04 DIAGNOSIS — Z5111 Encounter for antineoplastic chemotherapy: Secondary | ICD-10-CM

## 2011-07-04 DIAGNOSIS — C50419 Malignant neoplasm of upper-outer quadrant of unspecified female breast: Secondary | ICD-10-CM

## 2011-07-04 DIAGNOSIS — Z5189 Encounter for other specified aftercare: Secondary | ICD-10-CM

## 2011-07-04 LAB — CBC WITH DIFFERENTIAL/PLATELET
Basophils Absolute: 0 10*3/uL (ref 0.0–0.1)
Eosinophils Absolute: 0.1 10*3/uL (ref 0.0–0.5)
HCT: 32.1 % — ABNORMAL LOW (ref 34.8–46.6)
HGB: 11.1 g/dL — ABNORMAL LOW (ref 11.6–15.9)
MCV: 93.9 fL (ref 79.5–101.0)
MONO%: 11.8 % (ref 0.0–14.0)
NEUT#: 1.8 10*3/uL (ref 1.5–6.5)
NEUT%: 58 % (ref 38.4–76.8)
RDW: 18.2 % — ABNORMAL HIGH (ref 11.2–14.5)

## 2011-07-04 LAB — COMPREHENSIVE METABOLIC PANEL
Albumin: 3.1 g/dL — ABNORMAL LOW (ref 3.5–5.2)
Alkaline Phosphatase: 72 U/L (ref 39–117)
BUN: 8 mg/dL (ref 6–23)
Calcium: 8.5 mg/dL (ref 8.4–10.5)
Chloride: 106 mEq/L (ref 96–112)
Creatinine, Ser: 0.77 mg/dL (ref 0.50–1.10)
Glucose, Bld: 91 mg/dL (ref 70–99)
Potassium: 3.8 mEq/L (ref 3.5–5.3)

## 2011-07-04 LAB — CANCER ANTIGEN 27.29: CA 27.29: 22 U/mL (ref 0–39)

## 2011-07-13 ENCOUNTER — Ambulatory Visit: Payer: BC Managed Care – PPO | Attending: Oncology | Admitting: Physical Therapy

## 2011-07-13 DIAGNOSIS — I890 Lymphedema, not elsewhere classified: Secondary | ICD-10-CM | POA: Insufficient documentation

## 2011-07-13 DIAGNOSIS — IMO0001 Reserved for inherently not codable concepts without codable children: Secondary | ICD-10-CM | POA: Insufficient documentation

## 2011-07-13 DIAGNOSIS — M25619 Stiffness of unspecified shoulder, not elsewhere classified: Secondary | ICD-10-CM | POA: Insufficient documentation

## 2011-07-17 ENCOUNTER — Ambulatory Visit: Payer: BC Managed Care – PPO | Admitting: Physical Therapy

## 2011-07-19 ENCOUNTER — Ambulatory Visit: Payer: BC Managed Care – PPO | Admitting: Physical Therapy

## 2011-07-24 ENCOUNTER — Ambulatory Visit: Payer: BC Managed Care – PPO | Attending: Oncology | Admitting: Physical Therapy

## 2011-07-24 ENCOUNTER — Ambulatory Visit
Admission: RE | Admit: 2011-07-24 | Discharge: 2011-07-24 | Disposition: A | Discharge status: Home / Self Care | Payer: BC Managed Care – PPO | Source: Ambulatory Visit | Attending: Radiation Oncology | Admitting: Radiation Oncology

## 2011-07-24 ENCOUNTER — Encounter: Payer: BC Managed Care – PPO | Admitting: Physical Therapy

## 2011-07-24 DIAGNOSIS — IMO0001 Reserved for inherently not codable concepts without codable children: Secondary | ICD-10-CM | POA: Insufficient documentation

## 2011-07-24 DIAGNOSIS — I890 Lymphedema, not elsewhere classified: Secondary | ICD-10-CM | POA: Insufficient documentation

## 2011-07-24 DIAGNOSIS — M25619 Stiffness of unspecified shoulder, not elsewhere classified: Secondary | ICD-10-CM | POA: Insufficient documentation

## 2011-07-25 ENCOUNTER — Ambulatory Visit
Admission: RE | Admit: 2011-07-25 | Discharge: 2011-07-25 | Disposition: A | Discharge status: Home / Self Care | Payer: BC Managed Care – PPO | Source: Ambulatory Visit | Attending: Radiation Oncology | Admitting: Radiation Oncology

## 2011-07-25 VITALS — Wt 183.1 lb

## 2011-07-25 DIAGNOSIS — C50919 Malignant neoplasm of unspecified site of unspecified female breast: Secondary | ICD-10-CM

## 2011-07-25 NOTE — Progress Notes (Signed)
Weekly Management Note Current Dose: 25.2 Gy  Projected Dose: 61 Gy   Narrative:  The patient presents for routine under treatment assessment.  CBCT/MVCT images/Port film x-rays were reviewed.  The chart was checked. Looks good. Some inframammary soreness. Using sportsbra. Released by PT. Using lymphedema sleeve prn. Had mammogram in September with benign findings.  Physical Findings: Unchanged. No skin changes. Wt 183.  Impression:  The patient is tolerating radiation.  Plan:  Continue treatment as planned. Keep inframammary fold dry.

## 2011-07-26 ENCOUNTER — Ambulatory Visit
Admission: RE | Admit: 2011-07-26 | Discharge: 2011-07-26 | Disposition: A | Discharge status: Home / Self Care | Payer: BC Managed Care – PPO | Source: Ambulatory Visit | Attending: Radiation Oncology | Admitting: Radiation Oncology

## 2011-07-26 ENCOUNTER — Encounter: Payer: BC Managed Care – PPO | Admitting: Physical Therapy

## 2011-07-27 ENCOUNTER — Encounter: Payer: BC Managed Care – PPO | Admitting: Physical Therapy

## 2011-07-27 ENCOUNTER — Ambulatory Visit
Admission: RE | Admit: 2011-07-27 | Discharge: 2011-07-27 | Disposition: A | Discharge status: Home / Self Care | Payer: BC Managed Care – PPO | Source: Ambulatory Visit | Attending: Radiation Oncology | Admitting: Radiation Oncology

## 2011-07-28 ENCOUNTER — Ambulatory Visit
Admission: RE | Admit: 2011-07-28 | Discharge: 2011-07-28 | Disposition: A | Discharge status: Home / Self Care | Payer: BC Managed Care – PPO | Source: Ambulatory Visit | Attending: Radiation Oncology | Admitting: Radiation Oncology

## 2011-07-31 ENCOUNTER — Ambulatory Visit
Admission: RE | Admit: 2011-07-31 | Discharge: 2011-07-31 | Disposition: A | Discharge status: Home / Self Care | Payer: BC Managed Care – PPO | Source: Ambulatory Visit | Attending: Radiation Oncology | Admitting: Radiation Oncology

## 2011-07-31 ENCOUNTER — Encounter: Payer: BC Managed Care – PPO | Admitting: Physical Therapy

## 2011-08-01 ENCOUNTER — Ambulatory Visit
Admission: RE | Admit: 2011-08-01 | Discharge: 2011-08-01 | Disposition: A | Discharge status: Home / Self Care | Payer: BC Managed Care – PPO | Source: Ambulatory Visit | Attending: Radiation Oncology | Admitting: Radiation Oncology

## 2011-08-01 VITALS — Wt 184.4 lb

## 2011-08-01 DIAGNOSIS — C50419 Malignant neoplasm of upper-outer quadrant of unspecified female breast: Secondary | ICD-10-CM

## 2011-08-01 NOTE — Progress Notes (Signed)
Pt. Very uncomfortable , especially at bedtime. Using biafine for hyperpigmentation. Would like to try hydrogel pads for relief of skin on skin.

## 2011-08-02 ENCOUNTER — Encounter: Payer: BC Managed Care – PPO | Admitting: Physical Therapy

## 2011-08-02 ENCOUNTER — Ambulatory Visit
Admission: RE | Admit: 2011-08-02 | Discharge: 2011-08-02 | Disposition: A | Discharge status: Home / Self Care | Payer: BC Managed Care – PPO | Source: Ambulatory Visit | Attending: Radiation Oncology | Admitting: Radiation Oncology

## 2011-08-03 ENCOUNTER — Ambulatory Visit
Admission: RE | Admit: 2011-08-03 | Discharge: 2011-08-03 | Disposition: A | Discharge status: Home / Self Care | Payer: BC Managed Care – PPO | Source: Ambulatory Visit | Attending: Radiation Oncology | Admitting: Radiation Oncology

## 2011-08-04 ENCOUNTER — Ambulatory Visit
Admission: RE | Admit: 2011-08-04 | Discharge: 2011-08-04 | Disposition: A | Discharge status: Home / Self Care | Payer: BC Managed Care – PPO | Source: Ambulatory Visit | Attending: Radiation Oncology | Admitting: Radiation Oncology

## 2011-08-05 ENCOUNTER — Ambulatory Visit
Admission: RE | Admit: 2011-08-05 | Discharge: 2011-08-05 | Disposition: A | Discharge status: Home / Self Care | Payer: BC Managed Care – PPO | Source: Ambulatory Visit | Attending: Radiation Oncology | Admitting: Radiation Oncology

## 2011-08-07 ENCOUNTER — Ambulatory Visit
Admission: RE | Admit: 2011-08-07 | Discharge: 2011-08-07 | Disposition: A | Discharge status: Home / Self Care | Payer: BC Managed Care – PPO | Source: Ambulatory Visit | Attending: Radiation Oncology | Admitting: Radiation Oncology

## 2011-08-07 ENCOUNTER — Encounter: Payer: BC Managed Care – PPO | Admitting: Physical Therapy

## 2011-08-08 ENCOUNTER — Ambulatory Visit
Admission: RE | Admit: 2011-08-08 | Discharge: 2011-08-08 | Disposition: A | Discharge status: Home / Self Care | Payer: BC Managed Care – PPO | Source: Ambulatory Visit | Attending: Radiation Oncology | Admitting: Radiation Oncology

## 2011-08-08 ENCOUNTER — Ambulatory Visit: Payer: BC Managed Care – PPO | Admitting: Radiation Oncology

## 2011-08-08 DIAGNOSIS — C50419 Malignant neoplasm of upper-outer quadrant of unspecified female breast: Secondary | ICD-10-CM

## 2011-08-08 NOTE — Progress Notes (Addendum)
Weekly Management Note Current Dose: 45 Gy  Projected Dose: 61 Gy   Narrative:  The patient presents for routine under treatment assessment.  CBCT/MVCT images/Port film x-rays were reviewed.  The chart was checked. Has small amount of wet desquamation. Will apply neosporin and continue with hydrogel pads which have helped alleviate some of the discomfort. Has some itching will try if neosporin ineffective. No further lymphedema.   Physical Findings: Weight: 183 lb 11.2 oz (83.326 kg). Dry desquamation in inframammary fold. Tiny area of moist desquamation. Rest of breast is red.   Impression:  The patient is tolerating radiation.  Plan:  Continue treatment as planned. Continue biafene and hydrogel. Starts boost tomorrow.

## 2011-08-08 NOTE — Progress Notes (Signed)
Has small amount of wet desquamation. Will apply neosporin and continue with hydrogel pads which have helped alleviate Some of the discomfort. Has some itching will try if neosporin ineffective. Has completed 25 of 25 tx to right breast. Starts boost of 8 on 08/09/11.

## 2011-08-09 ENCOUNTER — Ambulatory Visit
Admission: RE | Admit: 2011-08-09 | Discharge: 2011-08-09 | Disposition: A | Discharge status: Home / Self Care | Payer: BC Managed Care – PPO | Source: Ambulatory Visit | Attending: Radiation Oncology | Admitting: Radiation Oncology

## 2011-08-09 ENCOUNTER — Encounter: Payer: BC Managed Care – PPO | Admitting: Physical Therapy

## 2011-08-11 ENCOUNTER — Ambulatory Visit: Payer: BC Managed Care – PPO

## 2011-08-14 ENCOUNTER — Encounter: Payer: BC Managed Care – PPO | Admitting: Physical Therapy

## 2011-08-14 ENCOUNTER — Ambulatory Visit
Admission: RE | Admit: 2011-08-14 | Discharge: 2011-08-14 | Disposition: A | Discharge status: Home / Self Care | Payer: BC Managed Care – PPO | Source: Ambulatory Visit | Attending: Radiation Oncology | Admitting: Radiation Oncology

## 2011-08-15 ENCOUNTER — Ambulatory Visit
Admission: RE | Admit: 2011-08-15 | Discharge: 2011-08-15 | Disposition: A | Discharge status: Home / Self Care | Payer: BC Managed Care – PPO | Source: Ambulatory Visit | Attending: Radiation Oncology | Admitting: Radiation Oncology

## 2011-08-15 DIAGNOSIS — C50419 Malignant neoplasm of upper-outer quadrant of unspecified female breast: Secondary | ICD-10-CM

## 2011-08-15 NOTE — Progress Notes (Signed)
Significant improvement in clearing of redness. Has moist desquamation under righ mammary fold. To continue with application of neosporin Bid to tid. And hydrogels. Has completed 3 of 8 tx of boost. Affect much brighter.decrease in pain.

## 2011-08-15 NOTE — Progress Notes (Signed)
Weekly Management Note Current Dose:   Gy  Projected Dose:  Gy   Narrative:  The patient presents for routine under treatment assessment.  CBCT/MVCT images/Port film x-rays were reviewed.  The chart was checked. Inframammary fold has healed up well. Still using neosporin. Biafene to rest of breast.  Physical Findings: Weight: 180 lb 11.2 oz (81.965 kg). Unchanged  Impression:  The patient is tolerating radiation.  Plan:  Continue treatment as planned. Continue neosporin/biafene. Last tx next week.

## 2011-08-16 ENCOUNTER — Encounter: Payer: BC Managed Care – PPO | Admitting: Physical Therapy

## 2011-08-16 ENCOUNTER — Ambulatory Visit
Admission: RE | Admit: 2011-08-16 | Discharge: 2011-08-16 | Disposition: A | Discharge status: Home / Self Care | Payer: BC Managed Care – PPO | Source: Ambulatory Visit | Attending: Radiation Oncology | Admitting: Radiation Oncology

## 2011-08-16 NOTE — Progress Notes (Signed)
CC:   Renee Mcdaniel, M.D. Renee Gosling, MD Renee Barman, MD  PROBLEM:  History of T1c N0 triple-negative breast cancer, status post lumpectomy and sentinel lymph node dissection, status post 4 cycles of adjuvant TC chemotherapy.  Renee Mcdaniel returns for followup.  She has successfully completed her 4 cycles of chemotherapy.  She relates no specific side effects or complaints. She does have a little bit of tearing, but no other problems with her hands or feet.  She had no other complications from chemotherapy and did extraordinarily well.  She will be seen by Dr. Michell Heinrich tomorrow with plan to start radiation therapy shortly.  As noted, she is triple negative.  PHYSICAL EXAMINATION:  General:  Today she is alert and oriented, in no acute distress.  Vital Signs:  Blood pressure is 106/65, temperature 99, pulse 106, respiratory rate 20, weight is 186.  Head and Neck:  No palpable adenopathy in the head and neck area.  Trachea midline.  No thyromegaly.  Extraocular movements are normal.  Lungs:  Clear.  Heart: Heart sounds are normal.  Breasts:  Her breasts are free of any obvious masses.  Both axillae are negative.  There is no nipple retraction or skin changes.  Abdomen:  No palpable hepatosplenomegaly.  No inguinal adenopathy.  Extremities:  No peripheral cyanosis, clubbing, or edema.  LABORATORIES:  Labs from 10/16:  White count of 3, ANC 1.8, hemoglobin 11.  Chemistries within normal limits.  IMPRESSION AND PLAN:  Renee Mcdaniel is doing well.  I will plan to see her again in followup after she has finished radiation for schedule for regular followup.    ______________________________ Pierce Crane, M.D., F.R.C.P.C. PR/MEDQ  D:  08/16/2011  T:  08/16/2011  Job:  [id number]

## 2011-08-17 ENCOUNTER — Ambulatory Visit: Admission: RE | Admit: 2011-08-17 | Payer: BC Managed Care – PPO | Source: Ambulatory Visit

## 2011-08-17 ENCOUNTER — Ambulatory Visit
Admission: RE | Admit: 2011-08-17 | Discharge: 2011-08-17 | Disposition: A | Discharge status: Home / Self Care | Payer: BC Managed Care – PPO | Source: Ambulatory Visit | Attending: Radiation Oncology | Admitting: Radiation Oncology

## 2011-08-18 ENCOUNTER — Ambulatory Visit
Admission: RE | Admit: 2011-08-18 | Discharge: 2011-08-18 | Disposition: A | Discharge status: Home / Self Care | Payer: BC Managed Care – PPO | Source: Ambulatory Visit | Attending: Radiation Oncology | Admitting: Radiation Oncology

## 2011-08-18 ENCOUNTER — Ambulatory Visit: Payer: BC Managed Care – PPO

## 2011-08-21 ENCOUNTER — Ambulatory Visit
Admission: RE | Admit: 2011-08-21 | Discharge: 2011-08-21 | Disposition: A | Discharge status: Home / Self Care | Payer: BC Managed Care – PPO | Source: Ambulatory Visit | Attending: Radiation Oncology | Admitting: Radiation Oncology

## 2011-08-22 ENCOUNTER — Ambulatory Visit
Admission: RE | Admit: 2011-08-22 | Discharge: 2011-08-22 | Disposition: A | Discharge status: Home / Self Care | Payer: BC Managed Care – PPO | Source: Ambulatory Visit | Attending: Radiation Oncology | Admitting: Radiation Oncology

## 2011-08-22 DIAGNOSIS — C50419 Malignant neoplasm of upper-outer quadrant of unspecified female breast: Secondary | ICD-10-CM

## 2011-08-22 NOTE — Progress Notes (Signed)
Weekly Management Note Current Dose: 61  Gy  Projected Dose: 61 Gy   Narrative:  The patient presents for routine under treatment assessment.  CBCT/MVCT images/Port film x-rays were reviewed.  The chart was checked. Skin is healing well. Very excited to be done with treatment. Has f/u appt.   Physical Findings: Weight: 183 lb 1.6 oz (83.054 kg). Skin is healed in inframammary fold. Skin is dark  Impression:  The patient is tolerating radiation.  Plan:  Continue treatment as planned. biafene x 1 week then switch to lotion with vit e. Refer to fynn. F/u 1 mo.

## 2011-08-22 NOTE — Progress Notes (Signed)
Pt completed today, dryness around right nipple, bright erythema under fold of r breast,stopped using neosporin,stated, better, still using biafine cream,gave [facility] flyer,pt is interested in Feb, session,wuill e-mail Terry moore-painter,suggessted vit e lotion 2 weeks post  Treatment,to continue the biafine cream till then, no c/o pain 10:05 AM

## 2011-09-03 ENCOUNTER — Encounter: Payer: Self-pay | Admitting: Radiation Oncology

## 2011-09-03 DIAGNOSIS — C50419 Malignant neoplasm of upper-outer quadrant of unspecified female breast: Secondary | ICD-10-CM

## 2011-09-03 NOTE — Progress Notes (Signed)
Weekly Management Note Current Dose: 34.2  Gy  Projected Dose: 61 Gy   Narrative:  The patient presents for routine under treatment assessment.  CBCT/MVCT images/Port film x-rays were reviewed.  The chart was checked. Uncomffortable under breast.   Physical Findings: Unchanged. Skin is dark and red.   Impression:  The patient is tolerating radiation.  Plan:  Continue treatment as planned.

## 2011-09-03 NOTE — Progress Notes (Signed)
Name: Renee Mcdaniel   MRN: [id number]  Date: 08/08/2011 DOB: 05-Dec-1965  Status:outpatient    DIAGNOSIS: Breast cancer.  CONSENT VERIFIED: yes   SET UP: Patient is setup supine   IMMOBILIZATION:  The following immobilization was used:Custom Moldable Pillow, breast board.   NARRATIVE: Renee Mcdaniel underwent complex simulation and treatment planning for her boost treatment today.  Her tumor volume was outlined on the planning CT scan.  Due to the depth of her cavity, electrons could not be used and a photon plan was developed.  2   fields will be used with 2 MLCs and wedges comprising 2  treatment devices.

## 2011-09-03 NOTE — Progress Notes (Signed)
[facility] Radiation Oncology Simulation Verification Note   Name: Renee Mcdaniel MRN: [id number]   Date: 08/08/2011  DOB: 11-06-1965  Status:outpatient   DIAGNOSIS:  1. Malignant neoplasm of upper-outer quadrant of female breast     POSITION: Patient was placed in the supine position on the treatment machine.  Isocenter and MLCS were reviewed and treatment was approved.  NARRATIVE: Patient tolerated simulation well.

## 2011-09-04 NOTE — Progress Notes (Signed)
CC:   [name], MD Pierce Crane, M.D., F.R.C.P.C.  DIAGNOSIS:  Right breast cancer.  TREATMENT DATES:  07/06/2011 to 08/22/2011.  ANATOMIC REGION TREATED: 1. Right breast. 2. Right breast boost.  DOSE: 1. 45 Gy at 1.8 Gy per fraction x25 fractions. 2. 16 Gy at 2 Gy per fraction x8 fractions.  BEAM ARRANGEMENT: 1. Opposed tangents with reduced fields. 2. Wedge pair.  BEAM ENERGY: 1. 6 and 10 mV photons. 2. 10 and 18 MeV photons.  TREATMENT TOLERANCE:  January really tolerated her treatment very well. She had inframammary soreness and desquamation.  This was treated with Biofen and Hydrogel.  FOLLOW UP:  I will plan on seeing her back in 1 month's time.  She also has regular scheduled followup with Dr. Dwain Sarna and Dr. Darnelle Catalan.  She knows to contact us with any questions or concerns.    ______________________________ Lurline Hare, M.D. SW/MEDQ  D:  09/04/2011  T:  09/03/2011  Job:  [id number]

## 2011-09-16 ENCOUNTER — Telehealth: Payer: Self-pay | Admitting: Oncology

## 2011-09-16 NOTE — Telephone Encounter (Signed)
spoke with spouse abd he stated that he would have her call to get her appt time    aom

## 2011-09-28 ENCOUNTER — Encounter: Payer: Self-pay | Admitting: Radiation Oncology

## 2011-09-28 ENCOUNTER — Ambulatory Visit
Admission: RE | Admit: 2011-09-28 | Discharge: 2011-09-28 | Disposition: A | Discharge status: Home / Self Care | Payer: BC Managed Care – PPO | Source: Ambulatory Visit | Attending: Radiation Oncology | Admitting: Radiation Oncology

## 2011-09-28 VITALS — BP 113/74 | HR 84 | Temp 97.9°F | Resp 18 | Wt 184.0 lb

## 2011-09-28 DIAGNOSIS — C50419 Malignant neoplasm of upper-outer quadrant of unspecified female breast: Secondary | ICD-10-CM

## 2011-09-28 MED ORDER — TRIAMCINOLONE ACETONIDE 0.5 % EX OINT: TOPICAL_OINTMENT | Freq: Two times a day (BID) | CUTANEOUS | Status: DC

## 2011-09-28 NOTE — Progress Notes (Signed)
C/o itching under breast, using anti itch cream.  Having trouble with feet , ankles and knees.

## 2011-10-02 NOTE — Progress Notes (Signed)
CC:   Pierce Crane, M.D., F.R.C.P.C. [name], MD  DIAGNOSIS:  T1c triple negative right breast cancer.  PREVIOUS RADIATION:  Total dose of 61 Gy completed on 08/22/2011.  INTERVAL SINCE TREATMENT:  1 month  INTERVAL HISTORY:  Renee Mcdaniel reports for followup today.  She has some itching but otherwise is doing well.  Her energy levels are improving.  Her hair is growing back.  She is continuing to have some arthralgias which are very bothersome to her.  She would like to talk to Dr. Donnie Coffin about that and has followup scheduled with him.  PHYSICAL EXAMINATION:  General Appearance:  She is a pleasant female in no distress sitting comfortably on the examining room table.  Breasts: Her right breast is still slightly hyperpigmented compared to her left. There are some red bumps and erythema underneath her right breast.  The skin is intact, however.  IMPRESSION:  T1c N0 triple negative right breast cancer status post lumpectomy and radiation with resolving acute effects  of treatment.  RECOMMENDATIONS:  I think she just has significant skin irritation.  The hydrocortisone that she is using helps some but not enough.  I have written her for some Kenalog cream to help out with that.  She knows about lotion with vitamin E and she is signing up for our Finding a New Normal class.  I have released her from followup with me.  She has regular scheduled followup Dr. Donnie Coffin and Dr. Dwain Sarna.    ______________________________ Renee Mcdaniel, M.D. SW/MEDQ  D:  10/02/2011  T:  10/02/2011  Job:  [id number]

## 2011-10-17 ENCOUNTER — Telehealth: Payer: Self-pay | Admitting: *Deleted

## 2011-10-17 ENCOUNTER — Other Ambulatory Visit (HOSPITAL_BASED_OUTPATIENT_CLINIC_OR_DEPARTMENT_OTHER): Payer: BC Managed Care – PPO | Admitting: Lab

## 2011-10-17 ENCOUNTER — Ambulatory Visit (HOSPITAL_BASED_OUTPATIENT_CLINIC_OR_DEPARTMENT_OTHER): Payer: BC Managed Care – PPO | Admitting: Oncology

## 2011-10-17 VITALS — BP 105/74 | HR 86 | Temp 98.2°F | Ht 62.0 in | Wt 185.7 lb

## 2011-10-17 DIAGNOSIS — Z853 Personal history of malignant neoplasm of breast: Secondary | ICD-10-CM

## 2011-10-17 DIAGNOSIS — Z5111 Encounter for antineoplastic chemotherapy: Secondary | ICD-10-CM

## 2011-10-17 DIAGNOSIS — C50419 Malignant neoplasm of upper-outer quadrant of unspecified female breast: Secondary | ICD-10-CM

## 2011-10-17 DIAGNOSIS — Z9221 Personal history of antineoplastic chemotherapy: Secondary | ICD-10-CM

## 2011-10-17 DIAGNOSIS — Z5189 Encounter for other specified aftercare: Secondary | ICD-10-CM

## 2011-10-17 DIAGNOSIS — Z923 Personal history of irradiation: Secondary | ICD-10-CM

## 2011-10-17 DIAGNOSIS — N951 Menopausal and female climacteric states: Secondary | ICD-10-CM

## 2011-10-17 LAB — CBC WITH DIFFERENTIAL/PLATELET
BASO%: 0.7 % (ref 0.0–2.0)
MCHC: 34.1 g/dL (ref 31.5–36.0)
MONO#: 0.4 10*3/uL (ref 0.1–0.9)
RBC: 4.37 10*6/uL (ref 3.70–5.45)
RDW: 13.5 % (ref 11.2–14.5)
WBC: 3.6 10*3/uL — ABNORMAL LOW (ref 3.9–10.3)
lymph#: 0.7 10*3/uL — ABNORMAL LOW (ref 0.9–3.3)

## 2011-10-17 MED ORDER — MAGIC MOUTHWASH W/LIDOCAINE: 10.0000 mL | Freq: Four times a day (QID) | ORAL | Status: DC

## 2011-10-17 NOTE — Patient Instructions (Signed)
Menopause Menopause is the normal time of life when menstrual periods stop completely. Menopause is complete when you have missed 12 consecutive menstrual periods. It usually occurs between the ages of 26 to 12, with an average age of 22. Very rarely does a woman develop menopause before 45 years old. At menopause, your ovaries stop producing the female hormones, estrogen and progesterone. This can cause undesirable symptoms and also affect your health. Sometimes the symptoms may occur 4 to 5 years before the menopause begins. There is no relationship between menopause and:  Oral contraceptives.     Number of children you had.     Race.    The age your menstrual periods started (menarche).  Heavy smokers and very thin women may develop menopause earlier in life. CAUSES  The ovaries stop producing the female hormones estrogen and progesterone.     Other causes include:     Surgery to remove both ovaries.     The ovaries stop functioning for no known reason.     Tumors of the pituitary gland in the brain.     Medical disease that affects the ovaries and hormone production.     Radiation treatment to the abdomen or pelvis.     Chemotherapy that affects the ovaries.  SYMPTOMS    Hot flashes.     Night sweats.     Decrease in sex drive.     Vaginal dryness and thinning of the vagina causing painful intercourse.     Dryness of the skin and developing wrinkles.     Headaches.    Tiredness.    Irritability.    Memory problems.     Weight gain.     Bladder infections.     Hair growth of the face and chest.     Infertility.  More serious symptoms include:  Loss of bone (osteoporosis) causing breaks (fractures).     Depression.    Hardening and narrowing of the arteries (atherosclerosis) causing heart attacks and strokes.  DIAGNOSIS    When the menstrual periods have stopped for 12 straight months.     Physical exam.     Hormone studies of the blood.    TREATMENT   There are many treatment choices and nearly as many questions about them. The decisions to treat or not to treat menopausal changes is an individual choice made with your caregiver. Your caregiver can discuss the treatments with you. Together, you can decide which treatment will work best for you. Your treatment choices may include:    Hormone therapy (estorgen and progesterone).     Non-hormonal medications.     Treating the individual symptoms with medication (for example antidepressants for depression).     Herbal medications that may help specific symptoms.     Counseling by a psychiatrist or psychologist.     Group therapy.     Lifestyle changes including:     Eating healthy.     Regular exercise.     Limiting caffeine and alcohol.     Stress management and meditation.     No treatment.  HOME CARE INSTRUCTIONS    Take the medication your caregiver gives you as directed.     Get plenty of sleep and rest.     Exercise regularly.     Eat a diet that contains calcium (good for the bones) and soy products (acts like estrogen hormone).     Avoid alcoholic beverages.     Do not smoke.  If you have hot flashes, dress in layers.     Take supplements, calcium and vitamin D to strengthen bones.     You can use over-the-counter lubricants or moisturizers for vaginal dryness.     Group therapy is sometimes very helpful.     Acupuncture may be helpful in some cases.  SEEK MEDICAL CARE IF:    You are not sure you are in menopause.     You are having menopausal symptoms and need advice and treatment.     You are still having menstrual periods after age 26.     You have pain with intercourse.     Menopause is complete (no menstrual period for 12 months) and you develop vaginal bleeding.     You need a referral to a specialist (gynecologist, psychiatrist or psychologist) for treatment.  SEEK IMMEDIATE MEDICAL CARE IF:    You have severe depression.      You have excessive vaginal bleeding.     You fell and think you have a broken bone.     You have pain when you urinate.     You develop leg or chest pain.     You have a fast pounding heart beat (palpitations).     You have severe headaches.     You develop vision problems.     You feel a lump in your breast.     You have abdominal pain or severe indigestion.  Document Released: 11/25/2003 Document Revised: 05/17/2011 Document Reviewed: 07/02/2008 [facility] Patient Information 2012 [place], [place].   Renee Mcdaniel  FOR HOT FLASHES PLEASE TRY PERIDIN C , AVAILABLE OVER THE COUNTER, IN ADDITION VITAMIN E 400 U/DAY. I WILL ORDER THE MAGIC MOUTHWASH FOR YOU. I WILL SEE YOU IN 3 MONTHS.

## 2011-10-17 NOTE — Telephone Encounter (Signed)
gave patient appointment for 12-2011 

## 2011-10-18 LAB — COMPREHENSIVE METABOLIC PANEL
ALT: 14 U/L (ref 0–35)
Albumin: 4 g/dL (ref 3.5–5.2)
CO2: 28 mEq/L (ref 19–32)
Calcium: 9 mg/dL (ref 8.4–10.5)
Chloride: 104 mEq/L (ref 96–112)
Potassium: 4.1 mEq/L (ref 3.5–5.3)
Sodium: 144 mEq/L (ref 135–145)
Total Bilirubin: 0.2 mg/dL — ABNORMAL LOW (ref 0.3–1.2)
Total Protein: 5.9 g/dL — ABNORMAL LOW (ref 6.0–8.3)

## 2011-10-18 LAB — VITAMIN D 25 HYDROXY (VIT D DEFICIENCY, FRACTURES): Vit D, 25-Hydroxy: 35 ng/mL (ref 30–89)

## 2011-10-18 NOTE — Progress Notes (Signed)
Hematology and Oncology Follow Up Visit  Renee Mcdaniel [id number] December 21, 1965 46 y.o. 10/18/2011 4:59 AM PCP Loreli Dollar, matt wakefield, stacy wentworth  Principle Diagnosis: Hx T1cNO, triple negative breast cancer, s/p lumpectomy 02/20/11, xrt  61 G completed 08/22/11 .  Interim History:  There have been no intercurrent illness, hospitalizations or medication changes. She has developed arthalgias and hot flashes since being seen last.  Medications: I have reviewed the patient's current medications.  Allergies:  Allergies  Allergen Reactions  . Augmentin Nausea And Vomiting  . Oxycodone Nausea Only    Past Medical History, Surgical history, Social history, and Family History were reviewed and updated.  Review of Systems: Constitutional:  Negative for fever, chills, night sweats, anorexia, weight loss, pain. Cardiovascular: no chest pain or dyspnea on exertion Respiratory: no cough, shortness of breath, or wheezing Neurological: negative Dermatological: negative ENT: negative Skin Gastrointestinal: no abdominal pain, change in bowel habits, or black or bloody stools Genito-Urinary: no dysuria, trouble voiding, or hematuria Hematological and Lymphatic: negative Breast: negative for breast lumps Musculoskeletal: positive for - joint pain and joint stiffness Remaining ROS negative.  Physical Exam: Blood pressure 105/74, pulse 86, temperature 98.2 F (36.8 C), height 5\' 2"  (1.575 m), weight 185 lb 11.2 oz (84.233 kg). ECOG: 0 General appearance: alert, cooperative and appears stated age Head: Normocephalic, without obvious abnormality, atraumatic Neck: no adenopathy, no carotid bruit, no JVD, supple, symmetrical, trachea midline and thyroid not enlarged, symmetric, no tenderness/mass/nodules Lymph nodes: Cervical, supraclavicular, and axillary nodes normal. Cardiac : regular rate and rhythm, no murmurs or gallops Pulmonary:clear to auscultation bilaterally and normal  percussion bilaterally Breasts: inspection negative, no nipple discharge or bleeding, no masses or nodularity palpable, rt breast posrt-xrt related changes, both axilla are negative Abdomen:soft, non-tender; bowel sounds normal; no masses,  no organomegaly Extremities negative Neuro: alert, oriented, normal speech, no focal findings or movement disorder noted  Lab Results: Lab Results  Component Value Date   WBC 3.6* 10/17/2011   HGB 13.0 10/17/2011   HCT 38.3 10/17/2011   MCV 87.6 10/17/2011   PLT 171 10/17/2011     Chemistry      Component Value Date/Time   NA 144 10/17/2011 1042   K 4.1 10/17/2011 1042   CL 104 10/17/2011 1042   CO2 28 10/17/2011 1042   BUN 14 10/17/2011 1042   CREATININE 0.80 10/17/2011 1042      Component Value Date/Time   CALCIUM 9.0 10/17/2011 1042   ALKPHOS 105 10/17/2011 1042   AST 15 10/17/2011 1042   ALT 14 10/17/2011 1042   BILITOT 0.2* 10/17/2011 1042      .pathology. Radiological Studies: chest X-ray n/a Mammogram F/u in the next 6 months. Bone density n/a  Impression and Plan: 46 yo woman with hx of triple negative breast cancer, s/p chemotherapy with TC x 4 cycles and xrt . She is having arthralgias which are a manifestation of menopause as wellas hot flashes. We discussed management which included analgesics, stretching exercises, use of peridin c as well as  vitamind . I will see her in 3 months.  More than 50% of the visit was spent in patient-related counselling   Pierce Crane, MD 1/30/20134:59 AM

## 2011-10-31 ENCOUNTER — Other Ambulatory Visit: Payer: Self-pay | Admitting: Obstetrics and Gynecology

## 2012-01-16 ENCOUNTER — Ambulatory Visit (HOSPITAL_BASED_OUTPATIENT_CLINIC_OR_DEPARTMENT_OTHER): Payer: BC Managed Care – PPO | Admitting: Physician Assistant

## 2012-01-16 ENCOUNTER — Telehealth: Payer: Self-pay | Admitting: *Deleted

## 2012-01-16 ENCOUNTER — Other Ambulatory Visit (HOSPITAL_BASED_OUTPATIENT_CLINIC_OR_DEPARTMENT_OTHER): Payer: BC Managed Care – PPO | Admitting: Lab

## 2012-01-16 VITALS — BP 118/83 | HR 94 | Temp 98.2°F | Ht 62.0 in | Wt 187.0 lb

## 2012-01-16 DIAGNOSIS — C50419 Malignant neoplasm of upper-outer quadrant of unspecified female breast: Secondary | ICD-10-CM

## 2012-01-16 DIAGNOSIS — R635 Abnormal weight gain: Secondary | ICD-10-CM

## 2012-01-16 LAB — CBC WITH DIFFERENTIAL/PLATELET
BASO%: 0.8 % (ref 0.0–2.0)
EOS%: 1.8 % (ref 0.0–7.0)
HCT: 40.2 % (ref 34.8–46.6)
MCH: 30.3 pg (ref 25.1–34.0)
MCHC: 33.2 g/dL (ref 31.5–36.0)
NEUT%: 60.2 % (ref 38.4–76.8)
lymph#: 1 10*3/uL (ref 0.9–3.3)

## 2012-01-16 NOTE — Patient Instructions (Addendum)
1. Vit. E  400-800IU per day.  2. "Joint supplements"  3. Vit. B-complex (regular strength) 1/day

## 2012-01-16 NOTE — Progress Notes (Signed)
Hematology and Oncology Follow Up Visit  VERCIE POKORNY [id number] May 04, 1966 46 y.o. 01/16/2012    HPI: Ms. Fuqua is a 46 year old [place] woman with a history of a T1c, N0 triple negative right breast carcinoma for which she underwent a right lumpectomy with sentinel node dissection followed by 4 cycles of adjuvant every 3 week Taxotere/Cytoxan with Neulasta support on day 2. Radiation therapy to the remaining right breast tissue was completed on 08/22/2011 under the care of Dr. Lurline Hare. On observation alone.  Interim History:   Beyla is seen today for routine followup pertain to her history of a triple-negative right breast carcinoma. Overall she actually is feeling quite well, except that she continues with menopausal symptoms. She notes that she has had hot flashes, no frank fevers chills or drenching night sweats. She also has appreciated some weight gain, she does admit that she is not exercising on a regular basis, but does request a nutritional consult. She denies any unusual shortness of breath or chest pain. No nausea, emesis, diarrhea or constipation issues. She does complain of vaginal dryness which does interfere with intimacy. She has been in contact with her gynecologist in this regard. She denies a diffuse joint pain, but does note that her ankles and feet are quite sensitive in the mornings, she has a prescription for gabapentin prescribed by her neurologist given her history of migraine headaches, but she is very reluctant to utilize this drug, since in the past it cause quite a bit of fatigue. She denies any palpable breast masses, but questions when her next mammogram should be done.  A detailed review of systems is otherwise noncontributory as noted below.  Review of Systems: Constitutional:  no weight loss, fever, night sweats and feels well Eyes: no complaints ENT: no complaints Cardiovascular: no chest pain or dyspnea on exertion Respiratory: no  cough, shortness of breath, or wheezing Neurological: no TIA or stroke symptoms Dermatological: negative Gastrointestinal: no abdominal pain, change in bowel habits, or black or bloody stools Genito-Urinary: no dysuria, trouble voiding, or hematuria Hematological and Lymphatic: negative Breast: negative Musculoskeletal: positive for - joint pain Remaining ROS negative.  Medications:   I have reviewed the patient's current medications.  Current Outpatient Prescriptions  Medication Sig Dispense Refill  . buPROPion (WELLBUTRIN XL) 150 MG 24 hr tablet 450 mg daily.       . OnabotulinumtoxinA, Cosmetic, (BOTOX COSMETIC IM) Inject into the muscle as needed.        . cetirizine (ZYRTEC) 10 MG tablet Take 10 mg by mouth daily.      Marland Kitchen EQUETRO 200 MG CP12 2 (two) times daily.       Marland Kitchen MAXALT 10 MG tablet as needed.       . meclizine (ANTIVERT) 32 MG tablet Take 32 mg by mouth daily as needed.        . ondansetron (ZOFRAN) 4 MG tablet as needed.         Allergies:  Allergies  Allergen Reactions  . Amoxicillin-Pot Clavulanate Nausea And Vomiting  . Oxycodone Nausea Only    Physical Exam: Filed Vitals:   01/16/12 1112  BP: 118/83  Pulse: 94  Temp: 98.2 F (36.8 C)    Body mass index is 34.20 kg/(m^2). Weight: 187 lbs. HEENT:  Sclerae anicteric, conjunctivae pink.  Oropharynx clear.  No mucositis or candidiasis.   Nodes:  No cervical, supraclavicular, or axillary lymphadenopathy palpated.  Breast Exam:  The right breast was examined, the lumpectomy scar is  benign without any frank dominant mass effect, no nipple inversion or discharge. Axilla is clear. The left breast was examined, is free of masses skin changes nipple inversion or discharge. Axilla is clear.  Lungs:  Clear to auscultation bilaterally.  No crackles, rhonchi, or wheezes.   Heart:  Regular rate and rhythm.   Abdomen:  Soft, nontender.  Positive bowel sounds.  No organomegaly or masses palpated.   Musculoskeletal:  No  focal spinal tenderness to palpation.  Extremities:  Benign.  No peripheral edema or cyanosis.   Skin:  Benign.   Neuro:  Nonfocal, alert and oriented x 3.   Lab Results: Lab Results  Component Value Date   WBC 3.6* 01/16/2012   HGB 13.4 01/16/2012   HCT 40.2 01/16/2012   MCV 91.2 01/16/2012   PLT 183 01/16/2012   NEUTROABS 2.2 01/16/2012     Chemistry      Component Value Date/Time   NA 144 10/17/2011 1042   K 4.1 10/17/2011 1042   CL 104 10/17/2011 1042   CO2 28 10/17/2011 1042   BUN 14 10/17/2011 1042   CREATININE 0.80 10/17/2011 1042      Component Value Date/Time   CALCIUM 9.0 10/17/2011 1042   ALKPHOS 105 10/17/2011 1042   AST 15 10/17/2011 1042   ALT 14 10/17/2011 1042   BILITOT 0.2* 10/17/2011 1042      Lab Results  Component Value Date   LABCA2 22 07/04/2011    Assessment:  Ms. 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 Radiation therapy to the remaining right breast tissue was completed on 08/22/2011 under the care of Dr. Lurline Hare. On observation alone. No evidence to suggest recurrent or metastatic disease.  Case has been reviewed with Dr. Pierce Crane.   Plan:  Overall, Lyrah is doing quite well. She has no evidence to suggest recurrent or metastatic disease. I have referred her to Zenovia Jarred our dietitian for nutritional consult. After discussion with Dr. Donnie Coffin, she will be referred for a right-sided mammogram in May 2013, since this will be 6 months after completion of radiation therapy to the right breast. She will then also be set up for bilateral diagnostic mammogram in September of 2013. We will plan on seeing her back in 4 months for followup to include a CBC, serum chemistries, LDH, and CA 2729. Lujain knows to contact  us prior if the need should arise.  This plan was reviewed with the patient, who voices understanding and agreement.  She knows to call with any changes or problems.    Evianna Chandran T, PA-C 01/16/2012

## 2012-01-16 NOTE — Telephone Encounter (Signed)
made patient for mammogram on 01-22-2012 and 06-05-2012 at 10:40am printed out calendar and gave to the patient

## 2012-01-17 LAB — COMPREHENSIVE METABOLIC PANEL
AST: 14 U/L (ref 0–37)
Albumin: 4.4 g/dL (ref 3.5–5.2)
Alkaline Phosphatase: 94 U/L (ref 39–117)
BUN: 12 mg/dL (ref 6–23)
Creatinine, Ser: 0.87 mg/dL (ref 0.50–1.10)
Potassium: 4.2 mEq/L (ref 3.5–5.3)

## 2012-01-22 ENCOUNTER — Ambulatory Visit: Payer: BC Managed Care – PPO | Admitting: Nutrition

## 2012-01-22 ENCOUNTER — Ambulatory Visit
Admission: RE | Admit: 2012-01-22 | Discharge: 2012-01-22 | Disposition: A | Discharge status: Home / Self Care | Payer: BC Managed Care – PPO | Source: Ambulatory Visit | Attending: Physician Assistant | Admitting: Physician Assistant

## 2012-01-22 DIAGNOSIS — C50419 Malignant neoplasm of upper-outer quadrant of unspecified female breast: Secondary | ICD-10-CM

## 2012-01-22 NOTE — Assessment & Plan Note (Signed)
Renee Mcdaniel is a 46 year old female patient diagnosed with triple-negative breast cancer.  She is status post her chemo and radiation therapy.  MEDICAL HISTORY INCLUDES:  Vertigo and migraines.  MEDICATIONS INCLUDE:  Wellbutrin and Zofran.  LABORATORIES:  Include a glucose of 106 and vitamin D of 46.  HEIGHT:  62 inches. WEIGHT:  187 pounds. BMI:  34.19.  The patient reports that she needs some guidance on diet and exercise for weight loss.  She presents by stating that she really does not care much for fruits and vegetables, but she will eat them if she has to. She is not exercising at the time and has several reasons why she feels that this is very difficult for her to do, including knee and feet issues.    NUTRITION DIAGNOSIS:  Overweight/Obesity related to patient not ready for diet and lifestyle changes as evidenced by BMI of 34.19.  INTERVENTION:  I have educated the patient on the importance of increasing plant-based diet and following a low-fat, lean protein diet. I have encouraged the patient to strive to increase vegetable intake using whatever vegetable she enjoys and tolerates.  I have encouraged fresh fruit over juices.  I have touched on strategies for avoiding hypoglycemia, which she reports that she has occasionally, for which she drinks orange juice.  I have encouraged her to continue to eat 3 meals a day with an emphasis on portion control.  We have discussed organic foods and which foods she could purchase if she so chooses to do so.  I have also educated her on the importance of adding exercise into her daily routine in any way that it is safe and possible for her to do.  I stressed the importance of small changes that she can incorporate into her lifestyle.  I am not sure the patient is really ready to make dietary changes and exercise changes in her life at this point in time. She is still verbalizing many reasons why she cannot do these things, so I am not sure of her level  of compliance.  MONITORING/EVALUATION (GOALS):  That patient will begin to incorporate a healthy plant-based diet to promote weight stabilization and begin to incorporate some exercise that is safe for her to do to improve her overall health.  NEXT VISIT:  There is no followup scheduled at this time.  The patient can call with questions or concerns.   ______________________________ Zenovia Jarred, RD, LDN Clinical Nutrition Specialist BN/MEDQ  D:  01/22/2012  T:  01/22/2012  Job:  [id number]

## 2012-03-29 ENCOUNTER — Telehealth: Payer: Self-pay | Admitting: Oncology

## 2012-03-29 NOTE — Telephone Encounter (Signed)
Pt called today to r/s 8/136 appt to 7/19 due to new issue (lump felt in breast). appt moved to 7/19 and pt transferred to desk nurse Benetta Spar) to discuss.

## 2012-04-05 ENCOUNTER — Other Ambulatory Visit (HOSPITAL_BASED_OUTPATIENT_CLINIC_OR_DEPARTMENT_OTHER): Payer: BC Managed Care – PPO | Admitting: Lab

## 2012-04-05 ENCOUNTER — Telehealth: Payer: Self-pay | Admitting: Oncology

## 2012-04-05 ENCOUNTER — Ambulatory Visit (HOSPITAL_BASED_OUTPATIENT_CLINIC_OR_DEPARTMENT_OTHER): Payer: BC Managed Care – PPO | Admitting: Oncology

## 2012-04-05 VITALS — BP 112/76 | HR 86 | Temp 98.5°F | Ht 62.0 in | Wt 193.7 lb

## 2012-04-05 DIAGNOSIS — C50419 Malignant neoplasm of upper-outer quadrant of unspecified female breast: Secondary | ICD-10-CM

## 2012-04-05 LAB — CBC WITH DIFFERENTIAL/PLATELET
Basophils Absolute: 0 10*3/uL (ref 0.0–0.1)
Eosinophils Absolute: 0.1 10*3/uL (ref 0.0–0.5)
HCT: 38.9 % (ref 34.8–46.6)
HGB: 13.1 g/dL (ref 11.6–15.9)
LYMPH%: 29.2 % (ref 14.0–49.7)
MCV: 89.8 fL (ref 79.5–101.0)
MONO%: 9.9 % (ref 0.0–14.0)
NEUT#: 2.4 10*3/uL (ref 1.5–6.5)
NEUT%: 58.1 % (ref 38.4–76.8)
Platelets: 175 10*3/uL (ref 145–400)

## 2012-04-05 LAB — COMPREHENSIVE METABOLIC PANEL
Albumin: 4.1 g/dL (ref 3.5–5.2)
Alkaline Phosphatase: 105 U/L (ref 39–117)
BUN: 10 mg/dL (ref 6–23)
Glucose, Bld: 83 mg/dL (ref 70–99)
Total Bilirubin: 0.3 mg/dL (ref 0.3–1.2)

## 2012-04-05 LAB — LACTATE DEHYDROGENASE: LDH: 165 U/L (ref 94–250)

## 2012-04-05 LAB — CANCER ANTIGEN 27.29: CA 27.29: 20 U/mL (ref 0–39)

## 2012-04-05 NOTE — Progress Notes (Signed)
Hematology and Oncology Follow Up Visit  Renee Mcdaniel [id number] 11-Sep-1966 46 y.o. 04/05/2012    HPI: Ms. 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 Radiation therapy to the remaining right breast tissue was completed on 08/22/2011 under the care of Dr. Lurline Hare. On observation alone.  Interim History:   Renee Mcdaniel is seen today for routine followup pertain to her history of a triple-negative right breast carcinoma. She's been feeling well. She has no residual side effects from chemotherapy. She does note some tenderness in her breast. She then mammogram 3 months ago which was normal. She also has some firmness and some fullness in the upper outer quadrant of the right breast near her scar. She knows other problems. A detailed review of systems is otherwise noncontributory as noted below.  Review of Systems: Constitutional:  no weight loss, fever, night sweats and feels well Eyes: no complaints ENT: no complaints Cardiovascular: no chest pain or dyspnea on exertion Respiratory: no cough, shortness of breath, or wheezing Neurological: no TIA or stroke symptoms Dermatological: negative Gastrointestinal: no abdominal pain, change in bowel habits, or black or bloody stools Genito-Urinary: no dysuria, trouble voiding, or hematuria Hematological and Lymphatic: negative Breast: negative Musculoskeletal: positive for - joint pain Remaining ROS negative.  Medications:   I have reviewed the patient's current medications.  Current Outpatient Prescriptions  Medication Sig Dispense Refill  . buPROPion (WELLBUTRIN XL) 150 MG 24 hr tablet 300 mg daily.       Marland Kitchen EQUETRO 200 MG CP12 2 (two) times daily.       Marland Kitchen MAXALT 10 MG tablet as needed.       .  meclizine (ANTIVERT) 32 MG tablet Take 32 mg by mouth daily as needed.        . OnabotulinumtoxinA, Cosmetic, (BOTOX COSMETIC IM) Inject into the muscle as needed.       . ondansetron (ZOFRAN) 4 MG tablet as needed.       . cetirizine (ZYRTEC) 10 MG tablet Take 10 mg by mouth daily.        Allergies:  Allergies  Allergen Reactions  . Amoxicillin-Pot Clavulanate Nausea And Vomiting  . Oxycodone Nausea Only    Physical Exam: Filed Vitals:   04/05/12 1055  BP: 112/76  Pulse: 86  Temp: 98.5 F (36.9 C)    Body mass index is 35.43 kg/(m^2). Weight: 187 lbs. HEENT:  Sclerae anicteric, conjunctivae pink.  Oropharynx clear.  No mucositis or candidiasis.   Nodes:  No cervical, supraclavicular, or axillary lymphadenopathy palpated.  Breast Exam:  The right breast was examined, the lumpectomy scar is benign without any frank dominant mass effect, no nipple inversion or discharge. Axilla is clear. She does have a ribbon firmness adjacent to the scar. There may be a seroma present. There is no discharge no nipple. I cannot appreciate any abnormalities in the axilla. The left breast was examined, is free of masses skin changes nipple inversion or discharge. Axilla is clear.  Lungs:  Clear to auscultation bilaterally.  No crackles, rhonchi, or wheezes.   Heart:  Regular rate and rhythm.   Abdomen:  Soft, nontender.  Positive bowel sounds.  No organomegaly or masses palpated.   Musculoskeletal:  No focal spinal tenderness to palpation.  Extremities:  Benign.  No peripheral edema or cyanosis.   Skin:  Benign.   Neuro:  Nonfocal, alert and oriented x 3.   Lab Results: Lab Results  Component Value Date   WBC 4.1 04/05/2012   HGB 13.1 04/05/2012   HCT 38.9 04/05/2012   MCV 89.8 04/05/2012   PLT 175 04/05/2012   NEUTROABS 2.4 04/05/2012     Chemistry      Component Value Date/Time   NA 142 01/16/2012 1101   K 4.2 01/16/2012 1101   CL 105 01/16/2012 1101   CO2 30 01/16/2012 1101   BUN 12 01/16/2012  1101   CREATININE 0.87 01/16/2012 1101      Component Value Date/Time   CALCIUM 9.7 01/16/2012 1101   ALKPHOS 94 01/16/2012 1101   AST 14 01/16/2012 1101   ALT 11 01/16/2012 1101   BILITOT 0.2* 01/16/2012 1101      Lab Results  Component Value Date   LABCA2 22 07/04/2011    Assessment:  Ms. 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 Radiation therapy to the remaining right breast tissue was completed on 08/22/2011 under the care of Dr. Lurline Hare. On observation alone. No evidence to suggest recurrent or metastatic disease.     Plan:  Overall, Renee Mcdaniel is doing quite well. I discussed the findings. We don't have any concerns about her right breast we'll go ahead and schedule her for an ultrasound. I tried to explain that it is not unusual to have discomfort in her breast after surgery and radiation. I suspect she may have a seroma present.    Pierce Crane, MD  04/05/2012

## 2012-04-05 NOTE — Telephone Encounter (Signed)
gve the pt her oct 2013 appt calendar along with the mammo/us breast appt at the [facility]

## 2012-04-09 ENCOUNTER — Ambulatory Visit
Admission: RE | Admit: 2012-04-09 | Discharge: 2012-04-09 | Disposition: A | Discharge status: Home / Self Care | Payer: BC Managed Care – PPO | Source: Ambulatory Visit | Attending: Oncology | Admitting: Oncology

## 2012-04-09 DIAGNOSIS — C50419 Malignant neoplasm of upper-outer quadrant of unspecified female breast: Secondary | ICD-10-CM

## 2012-04-30 ENCOUNTER — Ambulatory Visit: Payer: BC Managed Care – PPO | Admitting: Oncology

## 2012-04-30 ENCOUNTER — Other Ambulatory Visit: Payer: BC Managed Care – PPO | Admitting: Lab

## 2012-06-05 ENCOUNTER — Ambulatory Visit
Admission: RE | Admit: 2012-06-05 | Discharge: 2012-06-05 | Disposition: A | Discharge status: Home / Self Care | Payer: BC Managed Care – PPO | Source: Ambulatory Visit | Attending: Physician Assistant | Admitting: Physician Assistant

## 2012-06-05 DIAGNOSIS — C50419 Malignant neoplasm of upper-outer quadrant of unspecified female breast: Secondary | ICD-10-CM

## 2012-07-01 ENCOUNTER — Telehealth: Payer: Self-pay | Admitting: *Deleted

## 2012-07-01 NOTE — Telephone Encounter (Signed)
patient called and requested to move appointment to 08-29-2012 starting at 12:00pm

## 2012-07-02 ENCOUNTER — Other Ambulatory Visit: Payer: BC Managed Care – PPO | Admitting: Lab

## 2012-07-02 ENCOUNTER — Ambulatory Visit: Payer: BC Managed Care – PPO | Admitting: Oncology

## 2012-08-09 ENCOUNTER — Telehealth: Payer: Self-pay | Admitting: *Deleted

## 2012-08-09 NOTE — Telephone Encounter (Signed)
Patient confirmed over the phone the new date and time 09-16-2012 starting at 12:30pm

## 2012-08-29 ENCOUNTER — Other Ambulatory Visit: Payer: BC Managed Care – PPO | Admitting: Lab

## 2012-08-29 ENCOUNTER — Ambulatory Visit: Payer: BC Managed Care – PPO | Admitting: Oncology

## 2012-09-16 ENCOUNTER — Telehealth: Payer: Self-pay | Admitting: *Deleted

## 2012-09-16 ENCOUNTER — Other Ambulatory Visit (HOSPITAL_BASED_OUTPATIENT_CLINIC_OR_DEPARTMENT_OTHER): Payer: BC Managed Care – PPO

## 2012-09-16 ENCOUNTER — Ambulatory Visit (HOSPITAL_BASED_OUTPATIENT_CLINIC_OR_DEPARTMENT_OTHER): Payer: BC Managed Care – PPO | Admitting: Oncology

## 2012-09-16 VITALS — BP 103/67 | HR 87 | Temp 97.5°F | Resp 20 | Ht 62.0 in | Wt 197.3 lb

## 2012-09-16 DIAGNOSIS — Z853 Personal history of malignant neoplasm of breast: Secondary | ICD-10-CM

## 2012-09-16 DIAGNOSIS — Z171 Estrogen receptor negative status [ER-]: Secondary | ICD-10-CM

## 2012-09-16 DIAGNOSIS — C50419 Malignant neoplasm of upper-outer quadrant of unspecified female breast: Secondary | ICD-10-CM

## 2012-09-16 NOTE — Telephone Encounter (Signed)
Gave patient instructions on getting her 2014 

## 2012-09-16 NOTE — Progress Notes (Signed)
Hematology and Oncology Follow Up Visit  Renee Mcdaniel [id number] [birth year] 46 y.o. 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 Radiation therapy to the remaining right breast tissue was completed on 08/22/2011 under the care of Dr. Lurline Hare. On observation alone.  Interim History:   Renee Mcdaniel is seen today for routine followup pertain to her history of a triple-negative right breast carcinoma. She's been feeling well. for routine followup pertain to her history of a triple-negative right breast carcinoma. She's been feeling well. She has no residual side effects from chemotherapy. She has been diagnosed with prediabetes. Her weight has increased.  She does note some tenderness in her breast. A detailed review of systems is otherwise noncontributory as noted below.  Review of Systems: Constitutional:  no weight loss, fever, night sweats and feels well Eyes: no complaints ENT: no complaints Cardiovascular: no chest pain or dyspnea on exertion Respiratory: no cough, shortness of breath, or wheezing Neurological: no TIA or stroke symptoms Dermatological: negative Gastrointestinal: no abdominal pain, change in bowel habits, or black or bloody stools Genito-Urinary: no dysuria, trouble voiding, or hematuria Hematological and Lymphatic: negative Breast: negative Musculoskeletal: positive for - joint pain Remaining ROS negative.  Medications:   I have reviewed the patient's current medications.  Current Outpatient Prescriptions  Medication Sig Dispense Refill  . buPROPion (WELLBUTRIN XL) 150 MG 24 hr tablet 300 mg daily.       . cetirizine (ZYRTEC) 10 MG tablet Take 10 mg by mouth daily.      Marland Kitchen EQUETRO 200 MG CP12 2 (two) times daily.       Marland Kitchen MAXALT 10 MG tablet as needed.       . meclizine (ANTIVERT) 32 MG tablet Take 32 mg by  mouth daily as needed.        . OnabotulinumtoxinA, Cosmetic, (BOTOX COSMETIC IM) Inject into the muscle as needed.       . ondansetron (ZOFRAN) 4 MG tablet as needed.         Allergies:  Allergies  Allergen Reactions  . Amoxicillin-Pot Clavulanate Nausea And Vomiting  . Oxycodone Nausea Only    Physical Exam: Filed Vitals:   09/16/12 1302  BP: 103/67  Pulse: 87  Temp: 97.5 F (36.4 C)  Resp: 20    Body mass index is 36.09 kg/(m^2). Weight: 193  HEENT:  Sclerae anicteric, conjunctivae pink.  Oropharynx clear.  No mucositis or candidiasis.   Nodes:  No cervical, supraclavicular, or axillary lymphadenopathy palpated.   Breast Exam:  The right breast was examined, the lumpectomy scar is benign without any frank dominant mass effect, no nipple inversion or discharge. Axilla is clear. She does have a  firmness adjacent to the scar. There may be a seroma present. There is no discharge no nipple. I cannot appreciate any abnormalities in the axilla. The left breast was examined, is free of masses skin changes nipple inversion or discharge. Axilla is clear.  Lungs:  Clear to auscultation bilaterally.  No crackles, rhonchi, or wheezes.   Heart:  Regular rate and rhythm.   Abdomen:  Soft, nontender.  Positive bowel sounds.  No organomegaly or masses palpated.   Musculoskeletal:  No focal spinal tenderness to palpation.  Extremities:  Benign.  No peripheral edema or cyanosis.   Skin:  Benign.   Neuro:  Nonfocal, alert and  oriented x 3.   Lab Results: Lab Results  Component Value Date   WBC 4.1 04/05/2012   HGB 13.1 04/05/2012   HCT 38.9 04/05/2012   MCV 89.8 04/05/2012   PLT 175 04/05/2012   NEUTROABS 2.4 04/05/2012     Chemistry      Component Value Date/Time   NA 142 04/05/2012 1035   K 4.2 04/05/2012 1035   CL 105 04/05/2012 1035   CO2 28 04/05/2012 1035   BUN 10 04/05/2012 1035   CREATININE 0.87 04/05/2012 1035      Component Value Date/Time   CALCIUM 9.4 04/05/2012 1035    ALKPHOS 105 04/05/2012 1035   AST 16 04/05/2012 1035   ALT 14 04/05/2012 1035   BILITOT 0.3 04/05/2012 1035      Lab Results  Component Value Date   LABCA2 20 04/05/2012    Assessment:  Ms. 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 Radiation therapy to the remaining right breast tissue was completed on 08/22/2011 under the care of Dr. Lurline Hare. On observation alone. No evidence to suggest recurrent or metastatic disease.     Plan:  Overall, Renee Mcdaniel is doing quite well. A repeat mammogram and u/s done a few months ago was negative. She will be seen in 6 months.  Pierce Crane, MD  09/16/2012

## 2012-10-24 ENCOUNTER — Telehealth (INDEPENDENT_AMBULATORY_CARE_PROVIDER_SITE_OTHER): Payer: Self-pay

## 2012-10-24 NOTE — Telephone Encounter (Signed)
Called pt to move her appt up earlier from 11/08/12. Dr Dwain Sarna said he would see pt on 10/25/12 at 8:30am. The pt understands.

## 2012-10-25 ENCOUNTER — Ambulatory Visit (INDEPENDENT_AMBULATORY_CARE_PROVIDER_SITE_OTHER): Payer: BC Managed Care – PPO | Admitting: General Surgery

## 2012-10-25 ENCOUNTER — Encounter (INDEPENDENT_AMBULATORY_CARE_PROVIDER_SITE_OTHER): Payer: Self-pay | Admitting: General Surgery

## 2012-10-25 VITALS — BP 120/78 | HR 76 | Resp 18 | Ht 62.5 in | Wt 198.0 lb

## 2012-10-25 DIAGNOSIS — N644 Mastodynia: Secondary | ICD-10-CM

## 2012-10-25 DIAGNOSIS — Z853 Personal history of malignant neoplasm of breast: Secondary | ICD-10-CM

## 2012-10-25 NOTE — Patient Instructions (Signed)

## 2012-10-25 NOTE — Progress Notes (Signed)
Subjective:     Patient ID: Renee Mcdaniel, female   DOB: 1965/09/22, 47 y.o.   MRN: [id number]  HPI This is a 47 year old female who underwent a right breast lumpectomy, sentinel node biopsy, port placement in 2012. She has since had her port removed. 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 She is now under observation. She last saw medical oncology in December. She had a mammogram in September which is normal. For the past month she has noticed pain right behind the right nipple. This is associated with burning. She has no discharge or any crustiness she is noted on her nipple. Her bra does not bother this. It does bother her when she is hugging someone her she can she can't really lie down on this area. This has not gotten any better or any worse over the last month. She comes in today to have this evaluated.  Review of Systems  Constitutional: Negative for fever, chills and unexpected weight change.  HENT: Negative for hearing loss, congestion, sore throat, trouble swallowing and voice change.   Eyes: Negative for visual disturbance.  Respiratory: Negative for cough and wheezing.   Cardiovascular: Negative for chest pain, palpitations and leg swelling.  Gastrointestinal: Negative for nausea, vomiting, abdominal pain, diarrhea, constipation, blood in stool, abdominal distention and anal bleeding.  Genitourinary: Negative for hematuria, vaginal bleeding and difficulty urinating.  Musculoskeletal: Negative for arthralgias.  Skin: Negative for rash and wound.  Neurological: Negative for seizures, syncope and headaches.  Hematological: Negative for adenopathy. Does not bruise/bleed easily.  Psychiatric/Behavioral: Negative for confusion.       Objective:   Physical Exam  Vitals reviewed. Constitutional: She appears well-developed and well-nourished.  Neck: Neck supple.  Pulmonary/Chest: Right breast exhibits no inverted  nipple, no mass, no nipple discharge, no skin change and no tenderness. Left breast exhibits no inverted nipple, no mass, no nipple discharge, no skin change and no tenderness. Breasts are symmetrical.    Lymphadenopathy:    She has no cervical adenopathy.    She has no axillary adenopathy.       Right: No supraclavicular adenopathy present.       Left: No supraclavicular adenopathy present.       Assessment:     History of stage I right breast triple negative cancer Right nipple pain    Plan:     On her exam I do not detect any evidence of a clinical recurrence. I do think however due to her tumor in her symptoms we should follow this up with a right-sided diagnostic mammogram as well as a subareolar ultrasound. I will talk to her once this is completed. Otherwise I will see her in one year. She knows that Dr. Donnie Coffin is not at the cancer center anymore. She will need follow up with medical oncology in 6 months.

## 2012-10-29 ENCOUNTER — Ambulatory Visit
Admission: RE | Admit: 2012-10-29 | Discharge: 2012-10-29 | Disposition: A | Discharge status: Home / Self Care | Payer: BC Managed Care – PPO | Source: Ambulatory Visit | Attending: General Surgery | Admitting: General Surgery

## 2012-10-29 DIAGNOSIS — N644 Mastodynia: Secondary | ICD-10-CM

## 2012-11-08 ENCOUNTER — Ambulatory Visit (INDEPENDENT_AMBULATORY_CARE_PROVIDER_SITE_OTHER): Payer: BC Managed Care – PPO | Admitting: General Surgery

## 2012-11-20 ENCOUNTER — Telehealth (INDEPENDENT_AMBULATORY_CARE_PROVIDER_SITE_OTHER): Payer: Self-pay

## 2012-11-20 NOTE — Telephone Encounter (Signed)
Patient returned call. I gave her negative mgm results.

## 2012-11-20 NOTE — Telephone Encounter (Signed)
LMOM for pt to call me so I can give her the results of her mgm which are normal and pt needs to see Dr Dwain Sarna in one year.

## 2013-02-06 ENCOUNTER — Other Ambulatory Visit: Payer: Self-pay | Admitting: Obstetrics and Gynecology

## 2013-04-15 ENCOUNTER — Telehealth: Payer: Self-pay | Admitting: *Deleted

## 2013-04-15 NOTE — Telephone Encounter (Signed)
Called and spoke with patient and confirmed appt. For 04/24/13 at 1pm with Bernell List.  Then will become Dr. Darnelle Catalan.

## 2013-04-18 ENCOUNTER — Encounter: Payer: Self-pay | Admitting: *Deleted

## 2013-04-18 NOTE — Progress Notes (Signed)
Mailed letter and calendar with new apt.

## 2013-04-24 ENCOUNTER — Ambulatory Visit (HOSPITAL_BASED_OUTPATIENT_CLINIC_OR_DEPARTMENT_OTHER): Payer: BC Managed Care – PPO | Admitting: Family

## 2013-04-24 ENCOUNTER — Telehealth: Payer: Self-pay | Admitting: Oncology

## 2013-04-24 ENCOUNTER — Encounter: Payer: Self-pay | Admitting: Family

## 2013-04-24 ENCOUNTER — Ambulatory Visit (HOSPITAL_BASED_OUTPATIENT_CLINIC_OR_DEPARTMENT_OTHER): Payer: BC Managed Care – PPO | Admitting: Lab

## 2013-04-24 DIAGNOSIS — Z853 Personal history of malignant neoplasm of breast: Secondary | ICD-10-CM

## 2013-04-24 DIAGNOSIS — IMO0002 Reserved for concepts with insufficient information to code with codable children: Secondary | ICD-10-CM

## 2013-04-24 LAB — VITAMIN D 25 HYDROXY (VIT D DEFICIENCY, FRACTURES): Vit D, 25-Hydroxy: 35 ng/mL (ref 30–89)

## 2013-04-24 LAB — CBC WITH DIFFERENTIAL/PLATELET
Basophils Absolute: 0 10*3/uL (ref 0.0–0.1)
Eosinophils Absolute: 0.1 10*3/uL (ref 0.0–0.5)
HCT: 39.7 % (ref 34.8–46.6)
HGB: 13.6 g/dL (ref 11.6–15.9)
MONO#: 0.4 10*3/uL (ref 0.1–0.9)
NEUT#: 2.9 10*3/uL (ref 1.5–6.5)
NEUT%: 53.2 % (ref 38.4–76.8)
RDW: 13.4 % (ref 11.2–14.5)
lymph#: 1.9 10*3/uL (ref 0.9–3.3)

## 2013-04-24 LAB — COMPREHENSIVE METABOLIC PANEL (CC13)
AST: 30 U/L (ref 5–34)
Albumin: 3.6 g/dL (ref 3.5–5.0)
BUN: 12.8 mg/dL (ref 7.0–26.0)
CO2: 29 mEq/L (ref 22–29)
Calcium: 9.5 mg/dL (ref 8.4–10.4)
Chloride: 108 mEq/L (ref 98–109)
Glucose: 97 mg/dl (ref 70–140)
Potassium: 4 mEq/L (ref 3.5–5.1)

## 2013-04-24 MED ORDER — LIDOCAINE HCL 4 % EX SOLN: CUTANEOUS | Status: DC | PRN

## 2013-04-24 MED ORDER — GABAPENTIN 100 MG PO CAPS: 100.0000 mg | ORAL_CAPSULE | Freq: Every day | ORAL | Status: DC

## 2013-04-24 NOTE — Progress Notes (Addendum)
[facility]  [phone] [phone]  OFFICE PROGRESS NOTE   ID: Renee Mcdaniel   DOB: [birth year]  MR#: [id number]  QMV#:[id number]   PCP: Asa Lente, MD GYN: Stann Mainland. Vincente Poli, MD. SU: Emelia Loron, MD  RAD ONC: Lurline Hare, MD   HISTORY OF PRESENT ILLNESS: From Dr. Theron Arista Rubin's new patient evaluation note dated 02/15/2011: "This is a pleasant 47 year old woman who had previously been in good health.  She had noted a mass in her right breast and sought medical attention for this.  A mammogram performed showed a mass in the right breast at 10 o'clock position.  Ultrasound confirmed the presence of a 1 x 0.9 x 1.7-cm mass.  This was at 10 o'clock an intramammary lymph node seen as well.  Subsequent biopsy performed on 5/22 showed a high-grade invasive ductal cancer.  The associated intramammary lymph node was negative.  The invasive tumor was triple negative with a HER-2 ratio of 1.54 and had a proliferative index of 77%.  She also had an MRI scan performed which confirmed the presence of a mass measuring 1.6 x 1.4 x 1 cm.  There are no other masses appreciated.  The patient was seen in the Multidisciplinary [facility]."  Her subsequent history is as stated below.   INTERVAL HISTORY: Dr. Darnelle Catalan and I saw Renee Mcdaniel today for followup of invasive ductal carcinoma of the right breast.  The patient was last seen by Dr. Donnie Coffin on 09/16/2012.  Since her last office visit, the patient has been doing relatively well.  She is establishing herself with Dr. Darrall Dears service today.   REVIEW OF SYSTEMS: A 10 point review of systems was completed and is negative except ongoing vaginal dryness/night sweats/hot flashes, dyspareunia, and chronic migraine headaches.  We discussed over-the-counter remedies for vaginal dryness.  The patient states her hot flashes/night sweats are mild during the day and worse at night.  We will provide her with a prescription for  Gabapentin for her night sweats/hot flashes.  The patient is currently being treated with Botox injections at [facility] [place] for her chronic migraine headaches.  We will provide the patient with a prescription for lidocaine for dyspareunia.   PAST MEDICAL HISTORY: Past Medical History  Diagnosis Date  . Cancer     rt. breast ca, stage I  . Vertigo   . Migraines     chronic  . Thyroid disease   . Hyperlipidemia   . Prediabetes     PAST SURGICAL HISTORY: Past Surgical History  Procedure Laterality Date  . Breast surgery      s/p lumpectomy, sentinel node biopsy, port  . Cesarean section    . Tonsillectomy    . Nasal septum surgery    . Port-a-cath removal      FAMILY HISTORY Family History  Problem Relation Age of Onset  . Cancer Mother     breast  . Thyroid disease Sister   . Hyperlipidemia Sister   . Thyroid disease Sister   . Hyperlipidemia Sister   She had a mother with breast cancer at 64.  No other family history of breast, colon or ovarian cancer.   GYNECOLOGIC HISTORY: Gravida 2, para 1, one miscarriage, menarche at 81.  She has only had one menses in 10/2012 since undergoing chemotherapy in 2012.  She had been on hormone replacement with the NuvaRing on and off for a span of 2 years and this happened over 10  years ago.  She used birth control pills for 2 years from 62 through 1985.  SOCIAL HISTORY: Mrs. Croslin and her husband have been married since 1986.  They have a 66 year old daughter currently attending [employer] and an adopted daughter from [place] named Gracelyn who is 46 years old.  Mr. 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  They attend church services at [employer] in [place], [place].  In her spare time Mrs. Nass enjoys gardening and attending church services.   ADVANCED DIRECTIVES: Not on file  HEALTH MAINTENANCE: History   Substance Use Topics  . Smoking status: Never Smoker   . Smokeless tobacco: Never Used  . Alcohol Use: No     Comment: Rarely    Colonoscopy: N/A PAP: 02/06/2013 Bone density: N/A Lipid panel: Not on file  Allergies  Allergen Reactions  . Amoxicillin-Pot Clavulanate Nausea And Vomiting  . Oxycodone Nausea Only    Current Outpatient Prescriptions  Medication Sig Dispense Refill  . alclomethasone (ACLOVATE) 0.05 % cream Apply 1 application topically daily as needed.       Marland Kitchen atorvastatin (LIPITOR) 10 MG tablet Take 10 mg by mouth daily.      . calcium citrate-vitamin D (CITRACAL+D) 315-200 MG-UNIT per tablet Take 1 tablet by mouth 2 (two) times daily.      Marland Kitchen levothyroxine (SYNTHROID, LEVOTHROID) 50 MCG tablet Take 50 mcg by mouth daily.      Marland Kitchen MAXALT 10 MG tablet Take 10 mg by mouth as needed for migraine.       . meclizine (ANTIVERT) 32 MG tablet Take 32 mg by mouth daily as needed.        . metFORMIN (GLUCOPHAGE) 500 MG tablet Take 500 mg by mouth 2 (two) times daily with a meal.      . Olopatadine HCl (PATANASE) 0.6 % SOLN Place 1 each into the nose 2 (two) times daily.       . OnabotulinumtoxinA, Cosmetic, (BOTOX COSMETIC IM) Inject 1 application into the muscle as needed (Migraine headaches).       . ondansetron (ZOFRAN) 4 MG tablet Take 4 mg by mouth as needed.       . gabapentin (NEURONTIN) 100 MG capsule Take 1 capsule (100 mg total) by mouth at bedtime.  30 capsule  12  . lidocaine (XYLOCAINE) 4 % external solution Apply topically as needed. Apply Lidocaine liquid 3 minutes before vaginal penetration.  50 mL  1   No current facility-administered medications for this visit.    OBJECTIVE: There were no vitals filed for this visit.   There is no weight on file to calculate BMI.      ECOG FS: 1 - Symptomatic but completely ambulatory  General appearance: Alert, cooperative, well nourished, no apparent distress Head: Normocephalic, without obvious abnormality,  atraumatic Eyes: Conjunctivae/corneas clear, PERRLA, EOMI Nose: Nares, septum and mucosa are normal, no drainage or sinus tenderness Neck: No adenopathy, supple, symmetrical, trachea midline, no tenderness Resp: Clear to auscultation bilaterally Cardio: Regular rate and rhythm, S1, S2 normal, no murmur, click, rub or gallop Breasts: Pendulous bilaterally, left breast is visibly larger than right breast, right breast and axillary area have well-healed surgical scars, no nipple inversion, bilateral axillary fullness, firm inframammary ridges bilaterally GI: Soft, distended, non-tender, hypoactive bowel sounds, excessive habitus Extremities: Extremities normal, atraumatic, no cyanosis or edema Lymph nodes: Cervical, supraclavicular, and axillary nodes normal Neurologic: Grossly normal   LAB RESULTS: Lab  Results  Component Value Date   WBC 5.4 04/24/2013   NEUTROABS 2.9 04/24/2013   HGB 13.6 04/24/2013   HCT 39.7 04/24/2013   MCV 87.5 04/24/2013   PLT 181 04/24/2013      Chemistry      Component Value Date/Time   NA 144 04/24/2013 1452   NA 142 04/05/2012 1035   K 4.0 04/24/2013 1452   K 4.2 04/05/2012 1035   CL 105 04/05/2012 1035   CO2 29 04/24/2013 1452   CO2 28 04/05/2012 1035   BUN 12.8 04/24/2013 1452   BUN 10 04/05/2012 1035   CREATININE 0.9 04/24/2013 1452   CREATININE 0.87 04/05/2012 1035      Component Value Date/Time   CALCIUM 9.5 04/24/2013 1452   CALCIUM 9.4 04/05/2012 1035   ALKPHOS 85 04/24/2013 1452   ALKPHOS 105 04/05/2012 1035   AST 30 04/24/2013 1452   AST 16 04/05/2012 1035   ALT 31 04/24/2013 1452   ALT 14 04/05/2012 1035   BILITOT 0.30 04/24/2013 1452   BILITOT 0.3 04/05/2012 1035      Lab Results  Component Value Date   LABCA2 20 04/05/2012    Urinalysis No results found for this basename: colorurine,  appearanceur,  labspec,  phurine,  glucoseu,  hgbur,  bilirubinur,  ketonesur,  proteinur,  urobilinogen,  nitrite,  leukocytesur    STUDIES: 1.  The patient's last bilateral  digital diagnostic mammogram on 06/05/2012 showed there are scattered fibroglandular densities.  Right lumpectomy changes are present.  There is no suspicious dominant mass, nonsurgical architectural distortion or calcification to suggest malignancy. No mammographic evidence of malignancy.  Benign findings.  2.  The patient had a digital diagnostic right unilateral mammogram on 10/29/2012 due to nipple pain in her right breast for a week in which mammogram showed there is a scattered fibroglandular pattern. The patient has had a right lumpectomy.  No suspicious mass or calcifications are seen.  Normal tissue is imaged on spot tangential view in the subareolar region in the area of clinical concern.  Compared to priors.  No mammographic evidence of malignancy.    ASSESSMENT: Ms. Labo is a 47 y.o. BRCA negative, [place], [place] woman:  1.  Status post right breast needle core biopsy at the 10 o'clock position and right breast needle core biopsy of a lymph node at the 10 o'clock position on 02/07/2011 which showed in the right breast high-grade invasive ductal carcinoma in the lymph node benign lymph node negative for metastatic mammary carcinoma, estrogen receptor negative, progesterone receptor negative, Ki-67 77, HER-2/neu by CISH no amplification with a ratio of 1.54.  2.  The patient had a bilateral breast MRI on 02/10/2011 which showed in the right axilla, there was a 1.3 cm lymph node with thickened cortex and apparent replacement of the fatty hilum.  This was more superior to the previously biopsied intramammary lymph node in the 10 o'clock location.  The biopsied right breast 10 o'clock location lymph node demonstrates internal clip artifact, without cortical thickening, and measured 0.8 cm maximally, and demonstrated washin/washout type kinetics. Mild T2 - hyperintense post biopsy change noted in the 10 o'clock location of the right breast, at the site of biopsy-proven invasive ductal  carcinoma. No other area of abnormal T2-weighted hyperintensity was seen on either side. There was an oval mildly irregular mass-like enhancement at the site of biopsy-proven breast cancer in the 10 o'clock location of the right breast, measuring 1.6 x 1.4 x 1.0 cm.  This  demonstrated washin/washout kinetics.  Central clip artifact was noted. No other area of abnormal enhancement was seen in either breast (clinical stage I, T1 N0).  3.  The patient had genetics counseling and testing and a letter dated 02/27/2011 states the patient is negative for the BRCA1 and BRCA2 breast cancer genes.    4.  Status post right breast lumpectomy with right axillary sentinel node biopsy on 03/02/2011 for a stage I, pT1c pN0, 1.7 cm invasive ductal carcinoma (surgical resection margins negative for carcinoma), grade 3, estrogen receptor negative, progesterone receptor negative, Ki-67 77%, HER-2/neu by CISH no amplification detected, with 0/4 metastatic right axillary lymph nodes.    5.  The patient started adjuvant chemotherapy with TC (Taxotere/Cytoxan ) x 4 cycles from 04/06/2011 through 06/08/2011 with Neulasta support.  6.  The patient underwent radiation therapy from 07/06/2011 through 08/22/2011.  7.  Chronic migraine headaches (currently being treated with Botox injections at [facility] in [place])  8.  Night sweats/hot flashes  9.  Vaginal dryness/dyspareunia   PLAN: An electronic prescription for Gabapentin 100 mg by mouth each bedtime was sent to the patient's pharmacy [id number] with 12 refills 4 night sweats and hot flashes.  The patient states that she has taken this medication previously and 300 mg daily was too much medication for her (made her feel "loopy").  A variety of over-the-counter remedies were discussed for vaginal dryness.  An electronic prescription for lidocaine 4% topical liquid to be applied 3 minutes before vaginal penetration #50 mL with one refill was  sent to the patient's pharmacy for dyspareunia.  We plan to alternate visits with the patient's surgeon Dr. Dwain Sarna and our office so that the patient is seen by her care team every 6 months.  We will schedule Mrs. Janee Morn for an office visit with Dr. Dwain Sarna in 10/2013 and we plan to see her again in 04/2014.  We will check laboratories of CBC, CMP, LDH, and vitamin D level at that time.  We will schedule the patient's annual bilateral digital diagnostic mammogram in 05/2013 for her.    All questions were answered.  The patient was encouraged to contact us in the interim with any problems, questions or concerns.   Larina Bras, NP-C 04/24/2013, 5:54 PM  ADDENDUM: This 47 year old BRCA negative [place], [place] woman establish herself in my practice today. We reviewed her diagnosis, treatment history, and prognosis. In brief:  She underwent right breast upper outer quadrant lumpectomy and sentinel lymph node sampling June 2012 for a pT1c pN0, stage IA invasive ductal carcinoma, grade 3, triple negative, with an MIB-1 of 77%. Adjuvantly she received docetaxel and cyclophosphamide x4 completed in September of 2012, followed by adjuvant radiation completed in December of 2012.  She is now 3 years out from her surgery, which is favorable, since triple negative, if he are to recover, tend to recur early. Nevertheless she requires further followup, and we are going to see her on a yearly basis for the next 2 years alternating with her surgeon. At the 5 year mark we will consider releasing her to her primary care physician.  She knows to call for any problems that may develop before next visit here.  I personally saw this patient and performed a substantive portion of this encounter with the listed APP documented above.   Lowella Dell, MD

## 2013-04-24 NOTE — Patient Instructions (Addendum)
Please contact us at [phone] if you have any questions or concerns.  Please continue to do well and enjoy life!!!  Get plenty of rest, drink plenty of water, exercise daily (walking), eat a balanced diet.  Continue to take calcium Vitamin D3 daily.   Peridin-C (hotflashes and night sweats)

## 2013-04-24 NOTE — Telephone Encounter (Signed)
, °

## 2013-04-25 ENCOUNTER — Telehealth: Payer: Self-pay | Admitting: Family

## 2013-04-25 NOTE — Telephone Encounter (Signed)
Spoke to the patient and reviewed recent laboratories with her.  Would like her to take vitamin D3 1000 IUs by mouth daily in addition to her current calcium with vitamin D supplement.  The patient voiced understanding.

## 2013-06-06 ENCOUNTER — Ambulatory Visit
Admission: RE | Admit: 2013-06-06 | Discharge: 2013-06-06 | Disposition: A | Discharge status: Home / Self Care | Payer: BC Managed Care – PPO | Source: Ambulatory Visit | Attending: Family | Admitting: Family

## 2013-06-06 ENCOUNTER — Telehealth: Payer: Self-pay | Admitting: Family

## 2013-06-06 DIAGNOSIS — Z853 Personal history of malignant neoplasm of breast: Secondary | ICD-10-CM

## 2013-06-06 NOTE — Telephone Encounter (Signed)
Spoke to patient and let her know that her bilateral digital diagnostic mammogram showed no mammographic evidence of malignancy in either breast.  The patient voiced understanding.

## 2013-09-26 ENCOUNTER — Telehealth (INDEPENDENT_AMBULATORY_CARE_PROVIDER_SITE_OTHER): Payer: Self-pay | Admitting: *Deleted

## 2013-09-26 NOTE — Telephone Encounter (Signed)
It isnt' urgent most likely.  i can work her in next week sometime.

## 2013-09-26 NOTE — Telephone Encounter (Signed)
Patient called to report that over the last month she has noticed increased "size" in her right breast and right upper arm.  Patient reports she thinks the increasing size is due to swelling.  Patient also states increasing tenderness and pain in her right breast and upper right arm.  Patient reports times of redness and warmth but it comes and goes.  Patient asking to be seen by Dr. Donne Hazel.  Encouraged patient that if she feels this is urgent she may need to be seen in an urgent office over the weekend otherwise I will send a message to Dr. Donne Hazel to get his opinion on when the best time would be to bring the patient in then we will let her know on Monday.  Patient states understanding of the plan and agreeable at this time.  Patient had a right lumpectomy, node biopsy and PAC placed in 2012.  Patient was last seen in office in Feb 2014.

## 2013-09-29 NOTE — Telephone Encounter (Signed)
Called pt to make her an appt to see DR Donne Hazel for 1/15 arrive at 11:30/11:45. The pt is fine with this appt.

## 2013-10-02 ENCOUNTER — Ambulatory Visit
Admission: RE | Admit: 2013-10-02 | Discharge: 2013-10-02 | Disposition: A | Discharge status: Home / Self Care | Payer: BC Managed Care – PPO | Source: Ambulatory Visit | Attending: General Surgery | Admitting: General Surgery

## 2013-10-02 ENCOUNTER — Telehealth (INDEPENDENT_AMBULATORY_CARE_PROVIDER_SITE_OTHER): Payer: Self-pay

## 2013-10-02 ENCOUNTER — Encounter (INDEPENDENT_AMBULATORY_CARE_PROVIDER_SITE_OTHER): Payer: Self-pay | Admitting: General Surgery

## 2013-10-02 ENCOUNTER — Ambulatory Visit (INDEPENDENT_AMBULATORY_CARE_PROVIDER_SITE_OTHER): Payer: BC Managed Care – PPO | Admitting: General Surgery

## 2013-10-02 VITALS — BP 108/72 | HR 80 | Resp 14 | Ht 62.0 in | Wt 191.2 lb

## 2013-10-02 DIAGNOSIS — C50419 Malignant neoplasm of upper-outer quadrant of unspecified female breast: Secondary | ICD-10-CM

## 2013-10-02 DIAGNOSIS — N644 Mastodynia: Secondary | ICD-10-CM

## 2013-10-02 NOTE — Telephone Encounter (Signed)
LMOM at home and cell for pt to call me back. I want to give the pt good news about the mgm and u/s results from today. Dr Donne Hazel does want pt to have bilateral breast mri and see him for appt after the MRI. I want pt to call me once she gets her MRI appt so I can schedule the appt with Dr Donne Hazel.

## 2013-10-02 NOTE — Telephone Encounter (Signed)
Pt returned my call. I advised pt that her mgm and u/s were ok per Dr Donne Hazel. I advised pt that Dr Donne Hazel wants the pt to get a bilateral breast MRI. I asked for the pt to call me back once she gets her mri date b/c Dr Donne Hazel wants to see her back in the office again. The pt understands.

## 2013-10-02 NOTE — Addendum Note (Signed)
Addended by: Illene Regulus on: 10/02/2013 02:41 PM   Modules accepted: Orders

## 2013-10-02 NOTE — Progress Notes (Signed)
Subjective:     Patient ID: Renee Mcdaniel, female   DOB: Apr 24, 1966, 48 y.o.   MRN: [id number]  HPI This is a 48 year old female who underwent a right breast lumpectomy, sentinel node biopsy in 2012.  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 She is now under observation. In November she was lifting something heavy and then developed right breast swelling and pain. This hurts all the time but is worse with movement since then. She thinks the redness that was after radiation has now gotten a little bit worse but is not sure. There is no nipple discharge. She is not feel a discrete mass or anything else that is present at that site. She comes in today to have this evaluated. She has a recent mammogram which is negative.   Review of Systems DIGITAL DIAGNOSTIC BILATERAL MAMMOGRAM  COMPARISON: Previous mammograms.  ACR Breast Density Category a: The breast tissue is almost entirely  fatty.  FINDINGS:  There are no suspicious masses or calcifications seen in either  breast. At the surgical site in the right breast there is increased  density and architectural distortion consistent with a postsurgical  scar, unchanged when compared to the prior exam. Spot compression  magnification view of the lumpectomy site was performed. There is no  mammographic evidence of malignancy at the lumpectomy site.  Mammographic images were processed with CAD.  IMPRESSION:  1. Stable postsurgical changes in the right breast.  2. There is no mammographic evidence of malignancy in either breast.  RECOMMENDATION:  Screening mammogram in one year.(Code:SM-B-01Y)     Objective:   Physical Exam  Pulmonary/Chest: Right breast exhibits skin change (I think radiation change but there is faint redness) and tenderness. Right breast exhibits no inverted nipple, no mass and no nipple discharge. Left breast exhibits no inverted nipple, no mass, no nipple  discharge, no skin change and no tenderness.    Lymphadenopathy:    She has no cervical adenopathy.    She has no axillary adenopathy.       Right: No supraclavicular adenopathy present.       Left: No supraclavicular adenopathy present.       Assessment:     History stage I right breast tn cancer Right breast pain     Plan:     I think this may be related to her treatment and not a recurrence. She is at high risk for recurrence given her initial tumor though and we need to rule that out.'m going to send her for a mammogram and ultrasound today. Will follow up with her from there.

## 2013-10-03 ENCOUNTER — Telehealth (INDEPENDENT_AMBULATORY_CARE_PROVIDER_SITE_OTHER): Payer: Self-pay | Admitting: *Deleted

## 2013-10-03 NOTE — Telephone Encounter (Signed)
I spoke with pt and informed her of appt for her breast MRI at [facility] on 10/13/13 with an arrival time of 1:15pm.  She is agreeable with this appt.

## 2013-10-13 ENCOUNTER — Ambulatory Visit
Admission: RE | Admit: 2013-10-13 | Discharge: 2013-10-13 | Disposition: A | Discharge status: Home / Self Care | Payer: BC Managed Care – PPO | Source: Ambulatory Visit | Attending: General Surgery | Admitting: General Surgery

## 2013-10-13 DIAGNOSIS — C50419 Malignant neoplasm of upper-outer quadrant of unspecified female breast: Secondary | ICD-10-CM

## 2013-10-13 DIAGNOSIS — N644 Mastodynia: Secondary | ICD-10-CM

## 2013-10-13 MED ORDER — GADOBENATE DIMEGLUMINE 529 MG/ML IV SOLN
19.0000 mL | Freq: Once | INTRAVENOUS | Status: AC | PRN
  Administered 2013-10-13: 19 mL via INTRAVENOUS

## 2013-10-14 NOTE — Telephone Encounter (Signed)
Called pt to notify her that the breast mri results were normal per Dr Donne Hazel. I advised pt that I would call her back once I find out when pt needs a f/u appt again with Dr Donne Hazel. The pt understands.

## 2013-10-14 NOTE — Telephone Encounter (Signed)
I think this is likely just from treatment.  I think ok for her to just follow up routinely in a couple of weeks.  Mri is fine, exam ok, only other thing to do if not better is a punch biopsy but I think with all of this it is all ok

## 2013-10-15 NOTE — Telephone Encounter (Signed)
Called Renee Mcdaniel to notify her that Dr Donne Hazel will see her this Friday 1/30 at 8am to discuss wether she needs a punch bx since all of her studies have come back negative. The Renee Mcdaniel understands.

## 2013-10-17 ENCOUNTER — Ambulatory Visit (INDEPENDENT_AMBULATORY_CARE_PROVIDER_SITE_OTHER): Payer: BC Managed Care – PPO | Admitting: General Surgery

## 2013-10-17 ENCOUNTER — Encounter (INDEPENDENT_AMBULATORY_CARE_PROVIDER_SITE_OTHER): Payer: Self-pay | Admitting: General Surgery

## 2013-10-17 VITALS — BP 132/86 | HR 80 | Temp 98.3°F | Resp 14 | Ht 62.0 in | Wt 194.4 lb

## 2013-10-17 DIAGNOSIS — C50419 Malignant neoplasm of upper-outer quadrant of unspecified female breast: Secondary | ICD-10-CM

## 2013-10-17 DIAGNOSIS — N644 Mastodynia: Secondary | ICD-10-CM

## 2013-10-17 NOTE — Progress Notes (Signed)
Subjective:     Patient ID: Renee Mcdaniel, female   DOB: 08-Jul-1966, 48 y.o.   MRN: [id number]  HPI This is a 48 year old female who underwent a right breast lumpectomy, sentinel node biopsy in 2012. 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 She is now under observation. In November she was lifting something heavy and then developed right breast swelling and pain. This hurts all the time but is worse with movement since then. She thinks the redness that was after radiation has now gotten a little bit worse but is not sure. There is no nipple discharge. She is not feel a discrete mass or anything else that is present at that site. She has no change since last visit.  She comes back in after her mr.   Review of Systems THREE-DIMENSIONAL MR IMAGE RENDERING ON INDEPENDENT WORKSTATION:  Three-dimensional MR images were rendered by post-processing of the  original MR data on an independent workstation. The  three-dimensional MR images were interpreted, and findings are  reported in the following complete MRI report for this study. Three  dimensional images were evaluated at the independent DynaCad  workstation  COMPARISON: 02/10/2011 and prior breast MRIs. Recent mammograms and  ultrasounds from the breast center.  FINDINGS:  Breast composition: b. Scattered fibroglandular tissue.  Background parenchymal enhancement: Minimal  Right breast: Post lumpectomy changes with 2 cm postoperative  collection noted within the upper-outer right breast.  No abnormal areas of enhancement are noted to suggest breast  malignancy.  Left breast: No mass or abnormal enhancement.  Lymph nodes: No abnormal appearing lymph nodes.  Ancillary findings: None.  IMPRESSION:  No MR evidence of breast malignancy.  Post lumpectomy changes with 2 cm postoperative collection in the  upper-outer right breast.  RECOMMENDATION:  Bilateral diagnostic mammograms  in 05/2014 to resume annual  schedule.     Objective:   Physical Exam Right breast is without mass, well-healed scar, and no real erythema or other concerning feature of the skin    Assessment:     Status post treatment for stage I triple negative right breast cancer Right breast pain     Plan:     I don't think this is a recurrence of her breast cancer. I don't even think there is anything to do a punch biopsy  right now. I think we can just follow her for right now I will have her come back to see me in 6 weeks.

## 2013-11-26 ENCOUNTER — Encounter (INDEPENDENT_AMBULATORY_CARE_PROVIDER_SITE_OTHER): Payer: BC Managed Care – PPO | Admitting: General Surgery

## 2014-02-13 ENCOUNTER — Other Ambulatory Visit: Payer: Self-pay | Admitting: Obstetrics and Gynecology

## 2014-03-10 ENCOUNTER — Telehealth: Payer: Self-pay | Admitting: Oncology

## 2014-03-10 NOTE — Telephone Encounter (Signed)
returned pt call and r/s appt per pt request...pt ok and awre

## 2014-04-20 ENCOUNTER — Other Ambulatory Visit: Payer: BC Managed Care – PPO

## 2014-04-27 ENCOUNTER — Ambulatory Visit: Payer: BC Managed Care – PPO | Admitting: Oncology

## 2014-05-12 ENCOUNTER — Other Ambulatory Visit: Payer: Self-pay | Admitting: *Deleted

## 2014-05-12 DIAGNOSIS — C50419 Malignant neoplasm of upper-outer quadrant of unspecified female breast: Secondary | ICD-10-CM

## 2014-05-13 ENCOUNTER — Other Ambulatory Visit (HOSPITAL_BASED_OUTPATIENT_CLINIC_OR_DEPARTMENT_OTHER): Payer: BC Managed Care – PPO

## 2014-05-13 DIAGNOSIS — Z853 Personal history of malignant neoplasm of breast: Secondary | ICD-10-CM

## 2014-05-13 DIAGNOSIS — C50419 Malignant neoplasm of upper-outer quadrant of unspecified female breast: Secondary | ICD-10-CM

## 2014-05-13 LAB — CBC WITH DIFFERENTIAL/PLATELET
BASO%: 0.9 % (ref 0.0–2.0)
Basophils Absolute: 0.1 10*3/uL (ref 0.0–0.1)
EOS%: 1.5 % (ref 0.0–7.0)
Eosinophils Absolute: 0.1 10*3/uL (ref 0.0–0.5)
HCT: 41.4 % (ref 34.8–46.6)
HGB: 13.6 g/dL (ref 11.6–15.9)
LYMPH%: 27.3 % (ref 14.0–49.7)
MCH: 29.1 pg (ref 25.1–34.0)
MCHC: 32.8 g/dL (ref 31.5–36.0)
MCV: 88.8 fL (ref 79.5–101.0)
MONO#: 0.4 10*3/uL (ref 0.1–0.9)
MONO%: 7.3 % (ref 0.0–14.0)
NEUT#: 3.8 10*3/uL (ref 1.5–6.5)
NEUT%: 63 % (ref 38.4–76.8)
Platelets: 169 10*3/uL (ref 145–400)
RBC: 4.66 10*6/uL (ref 3.70–5.45)
RDW: 13.8 % (ref 11.2–14.5)
WBC: 6 10*3/uL (ref 3.9–10.3)
lymph#: 1.6 10*3/uL (ref 0.9–3.3)

## 2014-05-13 LAB — COMPREHENSIVE METABOLIC PANEL (CC13)
ALK PHOS: 79 U/L (ref 40–150)
ALT: 24 U/L (ref 0–55)
AST: 24 U/L (ref 5–34)
Albumin: 4 g/dL (ref 3.5–5.0)
Anion Gap: 8 mEq/L (ref 3–11)
BUN: 12.5 mg/dL (ref 7.0–26.0)
CO2: 28 mEq/L (ref 22–29)
Calcium: 9.5 mg/dL (ref 8.4–10.4)
Chloride: 107 mEq/L (ref 98–109)
Creatinine: 0.8 mg/dL (ref 0.6–1.1)
Glucose: 98 mg/dl (ref 70–140)
POTASSIUM: 4 meq/L (ref 3.5–5.1)
SODIUM: 143 meq/L (ref 136–145)
TOTAL PROTEIN: 6.3 g/dL — AB (ref 6.4–8.3)
Total Bilirubin: 0.46 mg/dL (ref 0.20–1.20)

## 2014-05-14 ENCOUNTER — Other Ambulatory Visit: Payer: Self-pay | Admitting: Oncology

## 2014-05-14 DIAGNOSIS — Z853 Personal history of malignant neoplasm of breast: Secondary | ICD-10-CM

## 2014-05-17 NOTE — Progress Notes (Signed)
[facility]  [phone] [phone]) [phone]  OFFICE PROGRESS NOTE   ID: Renee Mcdaniel   DOB: May 19, 1966  MR#: [id number]  ENI#:[id number]   PCP: Emerson Monte, MD GYN: Mayo Helane Rima, MD. SU: Rolm Bookbinder, MD  RAD ONC: Thea Silversmith, MD   HISTORY OF PRESENT ILLNESS: From Dr. Collier Salina Rubin's Renee patient evaluation note dated 02/15/2011: "This is a pleasant 48 year old woman who had previously been in good health.  She had noted a mass in her right breast and sought medical attention for this.  A mammogram performed showed a mass in the right breast at 10 o'clock position.  Ultrasound confirmed the presence of a 1 x 0.9 x 1.7-cm mass.  This was at 10 o'clock an intramammary lymph node seen as well.  Subsequent biopsy performed on 5/22 showed a high-grade invasive ductal cancer.  The associated intramammary lymph node was negative.  The invasive tumor was triple negative with a HER-2 ratio of 1.54 and had a proliferative index of 77%.  She also had an MRI scan performed which confirmed the presence of a mass measuring 1.6 x 1.4 x 1 cm.  There are no other masses appreciated.  The patient was seen in the Multidisciplinary Clinic."  In summary: this 48 year old BRCA negative Renee Mcdaniel, Renee Mcdaniel woman establish herself in my practice today. We reviewed her diagnosis, treatment history, and prognosis. In brief:  She underwent right breast upper outer quadrant lumpectomy and sentinel lymph node sampling June 2012 for a pT1c pN0, stage IA invasive ductal carcinoma, grade 3, triple negative, with an MIB-1 of 77%. Adjuvantly she received docetaxel and cyclophosphamide x4 completed in September of 2012, followed by adjuvant radiation completed in December of 2012.    Her subsequent history is as stated below.   INTERVAL HISTORY: Renee Mcdaniel returns today for followup of her triple negative breast cancer. She had a "scare" a few months ago, when she developed some right  upper extremity swelling and then redness. She brought this to Dr. Cristal Generous attention and she was evaluated with mammography and bilateral breast MRI January of this year. There was no evidence of malignancy. The problem has since resolved.  REVIEW OF SYSTEMS: Renee Mcdaniel is working hard at losing weight chiefly by avoiding "the whites". She is also exercising more, and doing some extra walking and some sit-ups at home. She sleeps well but in the middle of the afternoon she feels like she could use a nap most days. She has significant vaginal dryness problems and is working with Dr. Helane Rima on this. She is considering the laser treatments. Aside from these issues, a detailed review of systems today was entirely negative.  PAST MEDICAL HISTORY: Past Medical History  Diagnosis Date  . Cancer     rt. breast ca, stage I  . Vertigo   . Migraines     chronic  . Thyroid disease   . Hyperlipidemia   . Prediabetes     PAST SURGICAL HISTORY: Past Surgical History  Procedure Laterality Date  . Breast surgery      s/p lumpectomy, sentinel node biopsy, port  . Cesarean section    . Tonsillectomy    . Nasal septum surgery    . Port-a-cath removal      FAMILY HISTORY Family History  Problem Relation Age of Onset  . Cancer Mother     breast  . Thyroid disease Sister   . Hyperlipidemia Sister   . Thyroid disease Sister   . Hyperlipidemia Sister  She had a mother with breast cancer at 73.  No other family history of breast, colon or ovarian cancer.   GYNECOLOGIC HISTORY: Gravida 2, para 1, one miscarriage, menarche at 66.  She has only had one menses in 10/2012 since undergoing chemotherapy in 2012.  She had been on hormone replacement with the NuvaRing on and off for a span of 2 years and this happened over 10 years ago.  She used birth control pills for 2 years from 49 through 1985.  SOCIAL HISTORY: Renee Mcdaniel and her husband have been married since 1986.  They have a daughter who is in  her 15s and who is going to get married in November. They also have an adopted daughter from [place] named Vermilion who is 34 years old.  Her husband Sindy Mccune is a Microbiologist in the aluminum industry.  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  They attend church services at [employer]  in [place], [place].  In her spare time Mrs. Lecompte enjoys gardening and attending church services.   ADVANCED DIRECTIVES: Not on file  HEALTH MAINTENANCE: History  Substance Use Topics  . Smoking status: Never Smoker   . Smokeless tobacco: Never Used  . Alcohol Use: No     Comment: Rarely    Colonoscopy: N/A PAP: 02/06/2013 Bone density: N/A Lipid panel: Not on file  Allergies  Allergen Reactions  . Amoxicillin-Pot Clavulanate Nausea And Vomiting  . Oxycodone Nausea Only    Current Outpatient Prescriptions  Medication Sig Dispense Refill  . alclomethasone (ACLOVATE) 0.05 % cream Apply 1 application topically daily as needed.       Marland Kitchen atorvastatin (LIPITOR) 10 MG tablet Take 10 mg by mouth daily.      . calcium citrate-vitamin D (CITRACAL+D) 315-200 MG-UNIT per tablet Take 1 tablet by mouth 2 (two) times daily.      . cefdinir (OMNICEF) 300 MG capsule Take 300 mg by mouth 2 (two) times daily.      Marland Kitchen gabapentin (NEURONTIN) 100 MG capsule Take 1 capsule (100 mg total) by mouth at bedtime.  30 capsule  12  . levothyroxine (SYNTHROID, LEVOTHROID) 50 MCG tablet Take 50 mcg by mouth daily.      Marland Kitchen lidocaine (XYLOCAINE) 4 % external solution Apply topically as needed. Apply Lidocaine liquid 3 minutes before vaginal penetration.  50 mL  1  . MAXALT 10 MG tablet Take 10 mg by mouth as needed for migraine.       . meclizine (ANTIVERT) 32 MG tablet Take 32 mg by mouth daily as needed.        . metFORMIN (GLUCOPHAGE) 500 MG tablet Take 500 mg by mouth 2 (two) times daily with a meal.      . Olopatadine HCl (PATANASE) 0.6 % SOLN Place 1 each into the nose 2 (two) times daily.       . OnabotulinumtoxinA,  Cosmetic, (BOTOX COSMETIC IM) Inject 1 application into the muscle as needed (Migraine headaches).       . ondansetron (ZOFRAN) 4 MG tablet Take 4 mg by mouth as needed.        No current facility-administered medications for this visit.    OBJECTIVE: Middle-aged white woman in no acute distress Filed Vitals:   05/19/14 1018  BP: 115/69  Pulse: 88  Temp: 98.7 F (37.1 C)  Resp: 18     Body mass index is 33.83 kg/(m^2).      ECOG FS: 1 - Symptomatic but completely ambulatory  Sclerae  unicteric, pupils equal and reactive Oropharynx clear and moist-- no thrush No cervical or supraclavicular adenopathy Lungs no rales or rhonchi Heart regular rate and rhythm Abd soft, nontender, positive bowel sounds MSK no focal spinal tenderness, no upper extremity lymphedema Neuro: nonfocal, well oriented, appropriate affect Breasts: The right breast is status post lumpectomy and radiation. There is no evidence of local recurrence. The right axilla is benign. The left breast is larger than the right by approximately 10-15%. There are no findings of concern in the left axilla is benign   LAB RESULTS: Lab Results  Component Value Date   WBC 6.0 05/13/2014   NEUTROABS 3.8 05/13/2014   HGB 13.6 05/13/2014   HCT 41.4 05/13/2014   MCV 88.8 05/13/2014   PLT 169 05/13/2014      Chemistry      Component Value Date/Time   NA 143 05/13/2014 0939   NA 142 04/05/2012 1035   K 4.0 05/13/2014 0939   K 4.2 04/05/2012 1035   CL 105 04/05/2012 1035   CO2 28 05/13/2014 0939   CO2 28 04/05/2012 1035   BUN 12.5 05/13/2014 0939   BUN 10 04/05/2012 1035   CREATININE 0.8 05/13/2014 0939   CREATININE 0.87 04/05/2012 1035      Component Value Date/Time   CALCIUM 9.5 05/13/2014 0939   CALCIUM 9.4 04/05/2012 1035   ALKPHOS 79 05/13/2014 0939   ALKPHOS 105 04/05/2012 1035   AST 24 05/13/2014 0939   AST 16 04/05/2012 1035   ALT 24 05/13/2014 0939   ALT 14 04/05/2012 1035   BILITOT 0.46 05/13/2014 0939   BILITOT 0.3 04/05/2012  1035      Lab Results  Component Value Date   LABCA2 20 04/05/2012    Urinalysis No results found for this basename: colorurine,  appearanceur,  labspec,  phurine,  glucoseu,  hgbur,  bilirubinur,  ketonesur,  proteinur,  urobilinogen,  nitrite,  leukocytesur    STUDIES: Study Result    CLINICAL DATA: 48 year old female with history of right breast  cancer, right lumpectomy, chemotherapy and radiation therapy in  2012. Right breast pain.  EXAM:  BILATERAL BREAST MRI WITH AND WITHOUT CONTRAST  LABS: BUN and creatinine were obtained on site at [facility] at  [address]  Results: BUN 13 mg/dL, Creatinine 0.8 mg/dL.  TECHNIQUE:  Multiplanar, multisequence MR images of both breasts were obtained  prior to and following the intravenous administration of 70m of  MultiHance.  THREE-DIMENSIONAL MR IMAGE RENDERING ON INDEPENDENT WORKSTATION:  Three-dimensional MR images were rendered by post-processing of the  original MR data on an independent workstation. The  three-dimensional MR images were interpreted, and findings are  reported in the following complete MRI report for this study. Three  dimensional images were evaluated at the independent DynaCad  workstation  COMPARISON: 02/10/2011 and prior breast MRIs. Recent mammograms and  ultrasounds from the breast center.  FINDINGS:  Breast composition: b. Scattered fibroglandular tissue.  Background parenchymal enhancement: Minimal  Right breast: Post lumpectomy changes with 2 cm postoperative  collection noted within the upper-outer right breast.  No abnormal areas of enhancement are noted to suggest breast  malignancy.  Left breast: No mass or abnormal enhancement.  Lymph nodes: No abnormal appearing lymph nodes.  Ancillary findings: None.  IMPRESSION:  No MR evidence of breast malignancy.  Post lumpectomy changes with 2 cm postoperative collection in the  upper-outer right breast.  RECOMMENDATION:   Bilateral diagnostic mammograms in 05/2014 to resume annual  schedule.  BI-RADS CATEGORY 2: Benign finding(s).  Electronically Signed  By: Hassan Rowan M.D.  On: 10/13/2013 16:45        ASSESSMENT: Ms. Renee Mcdaniel is a 48 y.o. BRCA negative, Renee Mcdaniel, Renee Mcdaniel woman:  1.  Status post right breast needle core biopsy at the 10 o'clock position and right breast needle core biopsy of a lymph node at the 10 o'clock position on 02/07/2011 which showed in the right breast high-grade invasive ductal carcinoma in the lymph node benign lymph node negative for metastatic mammary carcinoma, estrogen receptor negative, progesterone receptor negative, Ki-67 77, HER-2/neu by CISH no amplification with a ratio of 1.54.  2.  The patient had a bilateral breast MRI on 02/10/2011 which showed in the right axilla, there was a 1.3 cm lymph node with thickened cortex and apparent replacement of the fatty hilum.  This was more superior to the previously biopsied intramammary lymph node in the 10 o'clock location.  The biopsied right breast 10 o'clock location lymph node demonstrates internal clip artifact, without cortical thickening, and measured 0.8 cm maximally, and demonstrated washin/washout type kinetics. Mild T2 - hyperintense post biopsy change noted in the 10 o'clock location of the right breast, at the site of biopsy-proven invasive ductal carcinoma. No other area of abnormal T2-weighted hyperintensity was seen on either side. There was an oval mildly irregular mass-like enhancement at the site of biopsy-proven breast cancer in the 10 o'clock location of the right breast, measuring 1.6 x 1.4 x 1.0 cm.  This demonstrated washin/washout kinetics.  Central clip artifact was noted. No other area of abnormal enhancement was seen in either breast (clinical stage I, T1 N0).  3.  The patient had genetics counseling and testing and a letter dated 02/27/2011 states the patient is negative for the BRCA1 and BRCA2 breast  cancer genes.    4.  Status post right breast lumpectomy with right axillary sentinel node biopsy on 03/02/2011 for a stage I, pT1c pN0, 1.7 cm invasive ductal carcinoma (surgical resection margins negative for carcinoma), grade 3, estrogen receptor negative, progesterone receptor negative, Ki-67 77%, HER-2/neu by CISH no amplification detected, with 0/4 metastatic right axillary lymph nodes.    5.  The patient started adjuvant chemotherapy with TC (Taxotere/Cytoxan ) x 4 cycles from 04/06/2011 through 06/08/2011 with Neulasta support.  6.  The patient underwent radiation therapy from 07/06/2011 through 08/22/2011.  7.  Chronic migraine headaches (currently being treated with Botox injections at [facility] in [place], [place])  8.  Night sweats/hot flashes  9.  Vaginal dryness/dyspareunia   PLAN: Renee Mcdaniel is doing terrific, now a little over 3 years out from her definitive surgery with no evidence of disease recurrence. She does have significant asymmetry between the breasts and she is interested in a left reduction mammoplasty. She would prefer to get that done at [facility] which is closer for her so I have placed a referral to Dr. Isa Rankin to discuss the possibility. Ideally Renee Mcdaniel would get it done this year, since she has a ready pretty much met her deductible.  She will see Dr. Donne Hazel again in about 6 months. She will see me again in one year. She knows to call for any problems that may develop before that visit.     05/19/2014, 10:47 AM   Chauncey Cruel, MD

## 2014-05-19 ENCOUNTER — Ambulatory Visit (HOSPITAL_BASED_OUTPATIENT_CLINIC_OR_DEPARTMENT_OTHER): Payer: BC Managed Care – PPO | Admitting: Oncology

## 2014-05-19 ENCOUNTER — Telehealth: Payer: Self-pay | Admitting: Oncology

## 2014-05-19 VITALS — BP 115/69 | HR 88 | Temp 98.7°F | Resp 18 | Ht 62.0 in | Wt 185.0 lb

## 2014-05-19 DIAGNOSIS — C50419 Malignant neoplasm of upper-outer quadrant of unspecified female breast: Secondary | ICD-10-CM

## 2014-05-19 DIAGNOSIS — Z853 Personal history of malignant neoplasm of breast: Secondary | ICD-10-CM

## 2014-05-19 NOTE — Addendum Note (Signed)
Addended by: Laureen Abrahams on: 05/19/2014 07:19 PM   Modules accepted: Orders

## 2014-05-19 NOTE — Telephone Encounter (Signed)
per pof to sch pt for appt-gave pt copy of sch-cld Dr Sharyn Lull [facility]-pt will call to make a ppt-office needed info from p[t-

## 2014-06-08 ENCOUNTER — Ambulatory Visit
Admission: RE | Admit: 2014-06-08 | Discharge: 2014-06-08 | Disposition: A | Discharge status: Home / Self Care | Payer: BC Managed Care – PPO | Source: Ambulatory Visit | Attending: Oncology | Admitting: Oncology

## 2014-06-08 DIAGNOSIS — Z853 Personal history of malignant neoplasm of breast: Secondary | ICD-10-CM

## 2015-03-16 ENCOUNTER — Other Ambulatory Visit: Payer: Self-pay | Admitting: Obstetrics and Gynecology

## 2015-03-17 LAB — CYTOLOGY - PAP

## 2015-03-31 ENCOUNTER — Telehealth: Payer: Self-pay | Admitting: *Deleted

## 2015-03-31 NOTE — Telephone Encounter (Signed)
Patient called stating that she has found a new lump in her left breast. Patient would like to be seen by MD Magrinat. [place] notified. Instructed to put POF for MD [place] office visit.  POF sent to scheduler.

## 2015-04-01 ENCOUNTER — Encounter: Payer: Self-pay | Admitting: Nurse Practitioner

## 2015-04-01 ENCOUNTER — Ambulatory Visit: Payer: Self-pay | Admitting: Nurse Practitioner

## 2015-04-01 ENCOUNTER — Ambulatory Visit (HOSPITAL_BASED_OUTPATIENT_CLINIC_OR_DEPARTMENT_OTHER): Payer: BLUE CROSS/BLUE SHIELD

## 2015-04-01 ENCOUNTER — Telehealth: Payer: Self-pay | Admitting: *Deleted

## 2015-04-01 ENCOUNTER — Telehealth: Payer: Self-pay | Admitting: Oncology

## 2015-04-01 ENCOUNTER — Other Ambulatory Visit: Payer: Self-pay | Admitting: *Deleted

## 2015-04-01 ENCOUNTER — Other Ambulatory Visit: Payer: Self-pay | Admitting: Nurse Practitioner

## 2015-04-01 ENCOUNTER — Telehealth: Payer: Self-pay | Admitting: Nurse Practitioner

## 2015-04-01 ENCOUNTER — Ambulatory Visit (HOSPITAL_BASED_OUTPATIENT_CLINIC_OR_DEPARTMENT_OTHER): Payer: BLUE CROSS/BLUE SHIELD | Admitting: Nurse Practitioner

## 2015-04-01 VITALS — BP 126/80 | HR 73 | Temp 98.3°F | Resp 18 | Ht 62.0 in | Wt 200.3 lb

## 2015-04-01 DIAGNOSIS — N941 Dyspareunia: Secondary | ICD-10-CM

## 2015-04-01 DIAGNOSIS — N6321 Unspecified lump in the left breast, upper outer quadrant: Secondary | ICD-10-CM

## 2015-04-01 DIAGNOSIS — Z853 Personal history of malignant neoplasm of breast: Secondary | ICD-10-CM | POA: Diagnosis not present

## 2015-04-01 DIAGNOSIS — N63 Unspecified lump in breast: Secondary | ICD-10-CM

## 2015-04-01 DIAGNOSIS — N951 Menopausal and female climacteric states: Secondary | ICD-10-CM

## 2015-04-01 DIAGNOSIS — N6320 Unspecified lump in the left breast, unspecified quadrant: Secondary | ICD-10-CM | POA: Insufficient documentation

## 2015-04-01 DIAGNOSIS — IMO0002 Reserved for concepts with insufficient information to code with codable children: Secondary | ICD-10-CM

## 2015-04-01 DIAGNOSIS — C50419 Malignant neoplasm of upper-outer quadrant of unspecified female breast: Secondary | ICD-10-CM

## 2015-04-01 DIAGNOSIS — C50411 Malignant neoplasm of upper-outer quadrant of right female breast: Secondary | ICD-10-CM

## 2015-04-01 LAB — COMPREHENSIVE METABOLIC PANEL (CC13)
ALT: 18 U/L (ref 0–55)
AST: 18 U/L (ref 5–34)
Albumin: 3.9 g/dL (ref 3.5–5.0)
Alkaline Phosphatase: 85 U/L (ref 40–150)
Anion Gap: 8 mEq/L (ref 3–11)
BILIRUBIN TOTAL: 0.54 mg/dL (ref 0.20–1.20)
BUN: 10.9 mg/dL (ref 7.0–26.0)
CHLORIDE: 109 meq/L (ref 98–109)
CO2: 27 mEq/L (ref 22–29)
Calcium: 9.5 mg/dL (ref 8.4–10.4)
Creatinine: 0.8 mg/dL (ref 0.6–1.1)
EGFR: 88 mL/min/{1.73_m2} — ABNORMAL LOW (ref >90)
Glucose: 88 mg/dl (ref 70–140)
Potassium: 4.3 mEq/L (ref 3.5–5.1)
Sodium: 144 mEq/L (ref 136–145)
TOTAL PROTEIN: 6.2 g/dL — AB (ref 6.4–8.3)

## 2015-04-01 LAB — CBC WITH DIFFERENTIAL/PLATELET
BASO%: 0.7 % (ref 0.0–2.0)
Basophils Absolute: 0 10*3/uL (ref 0.0–0.1)
EOS ABS: 0.1 10*3/uL (ref 0.0–0.5)
EOS%: 1.2 % (ref 0.0–7.0)
HEMATOCRIT: 42.2 % (ref 34.8–46.6)
HGB: 14 g/dL (ref 11.6–15.9)
LYMPH%: 35.5 % (ref 14.0–49.7)
MCH: 29.2 pg (ref 25.1–34.0)
MCHC: 33.1 g/dL (ref 31.5–36.0)
MCV: 88.4 fL (ref 79.5–101.0)
MONO#: 0.4 10*3/uL (ref 0.1–0.9)
MONO%: 7.2 % (ref 0.0–14.0)
NEUT%: 55.4 % (ref 38.4–76.8)
NEUTROS ABS: 2.9 10*3/uL (ref 1.5–6.5)
PLATELETS: 178 10*3/uL (ref 145–400)
RBC: 4.77 10*6/uL (ref 3.70–5.45)
RDW: 13.4 % (ref 11.2–14.5)
WBC: 5.3 10*3/uL (ref 3.9–10.3)
lymph#: 1.9 10*3/uL (ref 0.9–3.3)

## 2015-04-01 MED ORDER — LIDOCAINE HCL 4 % EX SOLN: CUTANEOUS | Status: DC | PRN

## 2015-04-01 NOTE — Progress Notes (Addendum)
[place]  [phone] [phone]  OFFICE PROGRESS NOTE   ID: Verne Spurr   DOB: May 19, 1966  MR#: [id number]  UMP#:[id number]   PCP: Adin Hector, MD III, MD GYN: Erik Obey. Helane Rima, MD. SU: Rolm Bookbinder, MD  RAD ONC: Thea Silversmith, MD  CHIEF COMPLAINT: triple negative breast cancer CURRENT TREATMENT: observation  BREAST CANCER HISTORY: From Dr. Collier Salina Rubin's new patient evaluation note dated 02/15/2011: "This is a pleasant 49 year old woman who had previously been in good health.  She had noted a mass in her right breast and sought medical attention for this.  A mammogram performed showed a mass in the right breast at 10 o'clock position.  Ultrasound confirmed the presence of a 1 x 0.9 x 1.7-cm mass.  This was at 10 o'clock an intramammary lymph node seen as well.  Subsequent biopsy performed on 5/22 showed a high-grade invasive ductal cancer.  The associated intramammary lymph node was negative.  The invasive tumor was triple negative with a HER-2 ratio of 1.54 and had a proliferative index of 77%.  She also had an MRI scan performed which confirmed the presence of a mass measuring 1.6 x 1.4 x 1 cm.  There are no other masses appreciated.  The patient was seen in the [facility]."  In summary: this 49 year old BRCA negative [place], [place] woman establish herself in my practice today. We reviewed her diagnosis, treatment history, and prognosis. In brief:  She underwent right breast upper outer quadrant lumpectomy and sentinel lymph node sampling June 2012 for a pT1c pN0, stage IA invasive ductal carcinoma, grade 3, triple negative, with an MIB-1 of 77%. Adjuvantly she received docetaxel and cyclophosphamide x4 completed in September of 2012, followed by adjuvant radiation completed in December of 2012.    Her subsequent history is as stated below.   INTERVAL HISTORY: Melyssa returns today for follow up of her triple  negative breast cancer on an urgent visit. She palpates a mass to her left breast right above the nipple. She found it about 1.5weeks ago, but waited to call us because she was hoping it was a cyst that would go away and it did not. It is not painful.   REVIEW OF SYSTEMS: Rikayla denies fevers, chills, nausea, vomiting, or change sin bowel or bladder habits. She has no shortness of breath, chest pain, cough, or palpitations. She was exercising more lately, but hurt her ankle and how has to wear a brace on this side. She is eating and drinking well. Her dyspareunia continues. She uses coconut oil for the vaginal dryness and lidocaine for pain with intercourse. She would like to have the [place] procedure, but this will cost her over $1500 and insurance will not help. A detailed review of systems is otherwise stable.   PAST MEDICAL HISTORY: Past Medical History  Diagnosis Date  . Cancer     rt. breast ca, stage I  . Vertigo   . Migraines     chronic  . Thyroid disease   . Hyperlipidemia   . Prediabetes     PAST SURGICAL HISTORY: Past Surgical History  Procedure Laterality Date  . Breast surgery      s/p lumpectomy, sentinel node biopsy, port  . Cesarean section    . Tonsillectomy    . Nasal septum surgery    . Port-a-cath removal      FAMILY HISTORY Family History  Problem Relation Age of Onset  . Cancer Mother  breast  . Thyroid disease Sister   . Hyperlipidemia Sister   . Thyroid disease Sister   . Hyperlipidemia Sister   She had a mother with breast cancer at 37.  No other family history of breast, colon or ovarian cancer.   GYNECOLOGIC HISTORY: Gravida 2, para 1, one miscarriage, menarche at 3.  She has only had one menses in 10/2012 since undergoing chemotherapy in 2012.  She had been on hormone replacement with the NuvaRing on and off for a span of 2 years and this happened over 10 years ago.  She used birth control pills for 2 years from 65 through  1985.  SOCIAL HISTORY: Mrs. Edelman and her husband have been married since 1986.  They have a daughter who is in her 29s and who is going to get married in November. They also have an adopted daughter from [place] named [place] who is 70 years old.  Her husband Melat Wrisley is a Microbiologist in the aluminum industry.  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  They attend church services at [employer]  in [place], [place].  In her spare time Mrs. Veach enjoys gardening and attending church services.   ADVANCED DIRECTIVES: Not on file  HEALTH MAINTENANCE: History  Substance Use Topics  . Smoking status: Never Smoker   . Smokeless tobacco: Never Used  . Alcohol Use: No     Comment: Rarely    Colonoscopy: N/A PAP: 02/06/2013 Bone density: N/A Lipid panel: Not on file  Allergies  Allergen Reactions  . Amoxicillin-Pot Clavulanate Nausea And Vomiting  . Oxycodone Nausea Only    Current Outpatient Prescriptions  Medication Sig Dispense Refill  . atorvastatin (LIPITOR) 10 MG tablet Take 10 mg by mouth daily.    . Diclofenac Potassium (CAMBIA) 50 MG PACK Take 1 each by mouth as needed.    Marland Kitchen levothyroxine (SYNTHROID, LEVOTHROID) 75 MCG tablet Take 75 mcg by mouth daily before breakfast.    . Multiple Vitamins-Minerals (WOMENS MULTI VITAMIN & MINERAL PO) Take 1 tablet by mouth daily.    . OnabotulinumtoxinA, Cosmetic, (BOTOX COSMETIC IM) Inject 1 application into the muscle as needed (Migraine headaches).     . ondansetron (ZOFRAN) 4 MG tablet Take 4 mg by mouth as needed.     Marland Kitchen alclomethasone (ACLOVATE) 0.05 % cream Apply 1 application topically daily as needed.     . lidocaine (XYLOCAINE) 4 % external solution Apply topically as needed. Apply Lidocaine liquid 3 minutes before vaginal penetration. 50 mL 1  . MAXALT 10 MG tablet Take 10 mg by mouth as needed for migraine.     . meclizine (ANTIVERT) 32 MG tablet Take 32 mg by mouth daily as needed.       No current  facility-administered medications for this visit.    OBJECTIVE: Middle-aged white woman in no acute distress Filed Vitals:   04/01/15 1044  BP: 126/80  Pulse: 73  Temp: 98.3 F (36.8 C)  Resp: 18     Body mass index is 36.63 kg/(m^2).      ECOG FS: 1 - Symptomatic but completely ambulatory  Skin: warm, dry  HEENT: sclerae anicteric, conjunctivae pink, oropharynx clear. No thrush or mucositis.  Lymph Nodes: No cervical or supraclavicular lymphadenopathy  Lungs: clear to auscultation bilaterally, no rales, wheezes, or rhonci  Heart: regular rate and rhythm  Abdomen: round, soft, non tender, positive bowel sounds  Musculoskeletal: No focal spinal tenderness, no peripheral edema, left ankle in brace Neuro: non focal, well oriented,  positive affect  Breasts: left breast status post mammoplasty. 21m mobile mass palpated to 1'oclock location. Right breast status post lumpectomy and radiation. No evidence of recurrent disease. Bilateral axillae benign.  LAB RESULTS: Lab Results  Component Value Date   WBC 6.0 05/13/2014   NEUTROABS 3.8 05/13/2014   HGB 13.6 05/13/2014   HCT 41.4 05/13/2014   MCV 88.8 05/13/2014   PLT 169 05/13/2014      Chemistry      Component Value Date/Time   NA 143 05/13/2014 0939   NA 142 04/05/2012 1035   K 4.0 05/13/2014 0939   K 4.2 04/05/2012 1035   CL 105 04/05/2012 1035   CO2 28 05/13/2014 0939   CO2 28 04/05/2012 1035   BUN 12.5 05/13/2014 0939   BUN 10 04/05/2012 1035   CREATININE 0.8 05/13/2014 0939   CREATININE 0.87 04/05/2012 1035      Component Value Date/Time   CALCIUM 9.5 05/13/2014 0939   CALCIUM 9.4 04/05/2012 1035   ALKPHOS 79 05/13/2014 0939   ALKPHOS 105 04/05/2012 1035   AST 24 05/13/2014 0939   AST 16 04/05/2012 1035   ALT 24 05/13/2014 0939   ALT 14 04/05/2012 1035   BILITOT 0.46 05/13/2014 0939   BILITOT 0.3 04/05/2012 1035      Lab Results  Component Value Date   LABCA2 20 04/05/2012    Urinalysis No results  found for: COLORURINE  STUDIES: No results found.  ASSESSMENT: Ms. TWidjajais a 49y.o. BRCA negative[place]  [place].  Status post right breast needle core biopsy at the 10 o'clock position and right breast needle core biopsy of a lymph node at the 10 o'clock position on 02/07/2011 which showed in the right breast high-grade invasive ductal carcinoma in the lymph node benign lymph node negative for metastatic mammary carcinoma, estrogen receptor negative, progesterone receptor negative, Ki-67 77, HER-2/neu by CISH no amplification with a ratio of 1.54.  2.  The patient had a bilateral breast MRI on 02/10/2011 which showed in the right axilla, there was a 1.3 cm lymph node with thickened cortex and apparent replacement of the fatty hilum.  This was more superior to the previously biopsied intramammary lymph node in the 10 o'clock location.  The biopsied right breast 10 o'clock location lymph node demonstrates internal clip artifact, without cortical thickening, and measured 0.8 cm maximally, and demonstrated washin/washout type kinetics. Mild T2 - hyperintense post biopsy change noted in the 10 o'clock location of the right breast, at the site of biopsy-proven invasive ductal carcinoma. No other area of abnormal T2-weighted hyperintensity was seen on either side. There was an oval mildly irregular mass-like enhancement at the site of biopsy-proven breast cancer in the 10 o'clock location of the right breast, measuring 1.6 x 1.4 x 1.0 cm.  This demonstrated washin/washout kinetics.  Central clip artifact was noted. No other area of abnormal enhancement was seen in either breast (clinical stage I, T1 N0).  3.  The patient had genetics counseling and testing and a letter dated 02/27/2011 states the patient is negative for the BRCA1 and BRCA2 breast cancer genes.    4.  Status post right breast lumpectomy with right axillary sentinel node biopsy on 03/02/2011 for a stage I, pT1c pN0,  1.7 cm invasive ductal carcinoma (surgical resection margins negative for carcinoma), grade 3, estrogen receptor negative, progesterone receptor negative, Ki-67 77%, HER-2/neu by CISH no amplification detected, with 0/4 metastatic right axillary lymph nodes.    5.  The patient started adjuvant chemotherapy with TC (Taxotere/Cytoxan ) x 4 cycles from 04/06/2011 through 06/08/2011 with Neulasta support.  6.  The patient underwent radiation therapy from 07/06/2011 through 08/22/2011.  7.  Chronic migraine headaches (currently being treated with Botox injections at [facility] in [place], [place])  8.  Night sweats/hot flashes  9.  Vaginal dryness/dyspareunia   PLAN: I am sending Kendahl to the [facility] for imaging immediately. Because it has been over 6 months since her last mammogram, we will have to repeat one on the left side before the ultrasound can be done.   She was due to have her annual follow up in 2 months anyway, so we went ahead and make this a full visit today. She was sent back to the lab after our visit, and I will call her with any abnormal results. Beside the new left breast mass, she is doing well with few complaints. Her lidocaine jelly was refilled today, and she will continue to use coconut oil for her extensive vaginal dryness.    I am making Mery a return visit in 2 weeks. If everything returns benign, we will simply cancel this appointment and she will return in 6 months for labs and a follow up visit. We will bring her back sooner, obviously, if a malignancy is found with her mammogram on Friday. She understands and agrees with this plan. She has been encouraged to call with any issues that might arise before her next visit here.   Laurie Panda, NP  04/01/2015, 11:36 AM

## 2015-04-01 NOTE — Telephone Encounter (Signed)
Called and spoke with pt to make her aware of appt on July 27th@ 9:45a Pt verbalized understanding. No further concerns. Message to be fwd to Gentry Fitz, NP.

## 2015-04-01 NOTE — Telephone Encounter (Signed)
lvm for pt regarding 1:45 appt today....i emailed HB to confirm 10am appt per pof....she would be dbl booked and she advised me to sched at 1:45

## 2015-04-01 NOTE — Telephone Encounter (Signed)
Gave avs & calendar for July & January.

## 2015-04-02 ENCOUNTER — Telehealth: Payer: Self-pay | Admitting: *Deleted

## 2015-04-02 ENCOUNTER — Ambulatory Visit
Admission: RE | Admit: 2015-04-02 | Discharge: 2015-04-02 | Disposition: A | Discharge status: Home / Self Care | Payer: BLUE CROSS/BLUE SHIELD | Source: Ambulatory Visit | Attending: Nurse Practitioner | Admitting: Nurse Practitioner

## 2015-04-02 DIAGNOSIS — N6321 Unspecified lump in the left breast, upper outer quadrant: Secondary | ICD-10-CM

## 2015-04-02 NOTE — Telephone Encounter (Signed)
Called to f/u with pt concerning results from Mammo and Ultrasound which did not show any evidence of malignancy. Pt was relieved and knows if she needs  anything to call us @[phone]. Communicated with pt that she will not need to see [place] on July 27th. Pt verbalized understanding. No further concerns. Message to be fwd to Gentry Fitz, NP.

## 2015-04-14 ENCOUNTER — Ambulatory Visit: Payer: BLUE CROSS/BLUE SHIELD | Admitting: Nurse Practitioner

## 2015-05-25 ENCOUNTER — Other Ambulatory Visit: Payer: BC Managed Care – PPO

## 2015-05-25 ENCOUNTER — Ambulatory Visit: Payer: BC Managed Care – PPO | Admitting: Oncology

## 2015-06-18 ENCOUNTER — Other Ambulatory Visit: Payer: Self-pay | Admitting: Oncology

## 2015-06-18 ENCOUNTER — Other Ambulatory Visit: Payer: Self-pay | Admitting: Nurse Practitioner

## 2015-06-18 DIAGNOSIS — Z9889 Other specified postprocedural states: Secondary | ICD-10-CM

## 2015-06-18 DIAGNOSIS — Z853 Personal history of malignant neoplasm of breast: Secondary | ICD-10-CM

## 2015-06-25 ENCOUNTER — Ambulatory Visit
Admission: RE | Admit: 2015-06-25 | Discharge: 2015-06-25 | Disposition: A | Discharge status: Home / Self Care | Payer: BLUE CROSS/BLUE SHIELD | Source: Ambulatory Visit | Attending: Oncology | Admitting: Oncology

## 2015-06-25 DIAGNOSIS — Z853 Personal history of malignant neoplasm of breast: Secondary | ICD-10-CM

## 2015-06-25 DIAGNOSIS — Z9889 Other specified postprocedural states: Secondary | ICD-10-CM

## 2015-10-06 ENCOUNTER — Other Ambulatory Visit: Payer: Self-pay

## 2015-10-06 DIAGNOSIS — C50411 Malignant neoplasm of upper-outer quadrant of right female breast: Secondary | ICD-10-CM

## 2015-10-07 ENCOUNTER — Telehealth: Payer: Self-pay | Admitting: Oncology

## 2015-10-07 ENCOUNTER — Other Ambulatory Visit (HOSPITAL_BASED_OUTPATIENT_CLINIC_OR_DEPARTMENT_OTHER): Payer: BLUE CROSS/BLUE SHIELD

## 2015-10-07 ENCOUNTER — Other Ambulatory Visit: Payer: Self-pay | Admitting: Oncology

## 2015-10-07 ENCOUNTER — Ambulatory Visit (HOSPITAL_BASED_OUTPATIENT_CLINIC_OR_DEPARTMENT_OTHER): Payer: BLUE CROSS/BLUE SHIELD | Admitting: Oncology

## 2015-10-07 VITALS — BP 106/64 | HR 70 | Temp 97.8°F | Resp 18 | Ht 62.0 in | Wt 206.4 lb

## 2015-10-07 DIAGNOSIS — Z853 Personal history of malignant neoplasm of breast: Secondary | ICD-10-CM | POA: Diagnosis not present

## 2015-10-07 DIAGNOSIS — N63 Unspecified lump in breast: Secondary | ICD-10-CM | POA: Diagnosis not present

## 2015-10-07 DIAGNOSIS — N6320 Unspecified lump in the left breast, unspecified quadrant: Secondary | ICD-10-CM

## 2015-10-07 DIAGNOSIS — C50411 Malignant neoplasm of upper-outer quadrant of right female breast: Secondary | ICD-10-CM

## 2015-10-07 DIAGNOSIS — G43909 Migraine, unspecified, not intractable, without status migrainosus: Secondary | ICD-10-CM | POA: Insufficient documentation

## 2015-10-07 DIAGNOSIS — G43009 Migraine without aura, not intractable, without status migrainosus: Secondary | ICD-10-CM | POA: Diagnosis not present

## 2015-10-07 LAB — COMPREHENSIVE METABOLIC PANEL
ALBUMIN: 3.9 g/dL (ref 3.5–5.0)
ALK PHOS: 94 U/L (ref 40–150)
ALT: 50 U/L (ref 0–55)
ANION GAP: 7 meq/L (ref 3–11)
AST: 36 U/L — ABNORMAL HIGH (ref 5–34)
BILIRUBIN TOTAL: 0.63 mg/dL (ref 0.20–1.20)
BUN: 12.2 mg/dL (ref 7.0–26.0)
CO2: 27 mEq/L (ref 22–29)
CREATININE: 0.9 mg/dL (ref 0.6–1.1)
Calcium: 9.3 mg/dL (ref 8.4–10.4)
Chloride: 107 mEq/L (ref 98–109)
EGFR: 74 mL/min/{1.73_m2} — ABNORMAL LOW (ref >90)
Glucose: 94 mg/dl (ref 70–140)
Potassium: 4.3 mEq/L (ref 3.5–5.1)
Sodium: 141 mEq/L (ref 136–145)
TOTAL PROTEIN: 6.3 g/dL — AB (ref 6.4–8.3)

## 2015-10-07 LAB — CBC WITH DIFFERENTIAL/PLATELET
BASO%: 0.6 % (ref 0.0–2.0)
Basophils Absolute: 0 10*3/uL (ref 0.0–0.1)
EOS%: 2.3 % (ref 0.0–7.0)
Eosinophils Absolute: 0.1 10*3/uL (ref 0.0–0.5)
HCT: 41.1 % (ref 34.8–46.6)
HGB: 13.5 g/dL (ref 11.6–15.9)
LYMPH%: 35.1 % (ref 14.0–49.7)
MCH: 29.3 pg (ref 25.1–34.0)
MCHC: 32.8 g/dL (ref 31.5–36.0)
MCV: 89.2 fL (ref 79.5–101.0)
MONO#: 0.4 10*3/uL (ref 0.1–0.9)
MONO%: 9.1 % (ref 0.0–14.0)
NEUT%: 52.9 % (ref 38.4–76.8)
NEUTROS ABS: 2.6 10*3/uL (ref 1.5–6.5)
PLATELETS: 169 10*3/uL (ref 145–400)
RBC: 4.61 10*6/uL (ref 3.70–5.45)
RDW: 13.4 % (ref 11.2–14.5)
WBC: 4.9 10*3/uL (ref 3.9–10.3)
lymph#: 1.7 10*3/uL (ref 0.9–3.3)

## 2015-10-07 NOTE — Telephone Encounter (Signed)
Gave patient avs report and appointments for Feb/May/Oct and mammo for 1/24 @ [facility]. Per patient Oct labs schedule same day as visit 1 hr before seeing GM.

## 2015-10-07 NOTE — Progress Notes (Signed)
[place]  [phone] [phone]  OFFICE PROGRESS NOTE   ID: Renee Mcdaniel   DOB: 1965-11-14  MR#: [id number]  ZHY#:[id number]   PCP: Renee Hector, MD III, MD GYN: Renee Mcdaniel. Renee Rima, MD. SU: Renee Bookbinder, MD  RAD ONC: Renee Silversmith, MD  CHIEF COMPLAINT: triple negative breast cancer  CURRENT TREATMENT: observation  BREAST CANCER HISTORY: From Dr. Collier Salina Mcdaniel's new patient evaluation note dated 02/15/2011:  "This is a pleasant 50 year old woman who had previously been in good health.  She had noted a mass in her right breast and sought medical attention for this.  A mammogram performed showed a mass in the right breast at 10 o'clock position.  Ultrasound confirmed the presence of a 1 x 0.9 x 1.7-cm mass.  This was at 10 o'clock an intramammary lymph node seen as well.  Subsequent biopsy performed on 5/22 showed a high-grade invasive ductal cancer.  The associated intramammary lymph node was negative.  The invasive tumor was triple negative with a HER-2 ratio of 1.54 and had a proliferative index of 77%.  She also had an MRI scan performed which confirmed the presence of a mass measuring 1.6 x 1.4 x 1 cm.  There are no other masses appreciated.  The patient was seen in the [facility]."  In summary: this 50 year old BRCA negative Renee Mcdaniel, [place] woman establish herself in my practice today. We reviewed her diagnosis, treatment history, and prognosis. In brief:  She underwent right breast upper outer quadrant lumpectomy and sentinel lymph node sampling June 2012 for a pT1c pN0, stage IA invasive ductal carcinoma, grade 3, triple negative, with an MIB-1 of 77%. Adjuvantly she received docetaxel and cyclophosphamide x4 completed in September of 2012, followed by adjuvant radiation completed in December of 2012.    Her subsequent history is as stated below.   INTERVAL HISTORY: Renee Mcdaniel returns today for follow up of her triple  negative breast cancer. We had a scare last time with a mass in her left breast but that proved to be an oil gland.  She had mammography in October 2016 which was unremarkable. She has now noted to other spots in her left breast which she wanted me to evaluate.  REVIEW OF SYSTEMS: Renee Mcdaniel  Had a home sleep study and that did show that she probably does have sleep apnea. She is having a formal polysomnography study in [place] later this month. She continues to have migraines, and this contributes to her feeling fatigued.  She continues to have problems with migraines, for which she receives Botox treatments. Because of the headaches and feeling tired (she is taking naps during the day) she is not on a regular exercise program at present.  she complains of seasonal allergies. A detailed review of systems today was otherwise stable  PAST MEDICAL HISTORY: Past Medical History  Diagnosis Date  . Cancer     rt. breast ca, stage I  . Vertigo   . Migraines     chronic  . Thyroid disease   . Hyperlipidemia   . Prediabetes     PAST SURGICAL HISTORY: Past Surgical History  Procedure Laterality Date  . Breast surgery      s/p lumpectomy, sentinel node biopsy, port  . Cesarean section    . Tonsillectomy    . Nasal septum surgery    . Port-a-cath removal      FAMILY HISTORY Family History  Problem Relation Age of Onset  . Cancer Mother  breast  . Thyroid disease Sister   . Hyperlipidemia Sister   . Thyroid disease Sister   . Hyperlipidemia Sister   She had a mother with breast cancer at 62.  No other family history of breast, colon or ovarian cancer.   GYNECOLOGIC HISTORY: Gravida 2, para 1, one miscarriage, menarche at 76.  She has only had one menses in 10/2012 since undergoing chemotherapy in 2012.  She had been on hormone replacement with the NuvaRing on and off for a span of 2 years and this happened over 10 years ago.  She used birth control pills for 2 years from 65 through  1985.  SOCIAL HISTORY: Renee Mcdaniel and her husband have been married since 1986.  They have a daughter who is in her 49s and who is going to get married in November. They also have an adopted daughter from [place] named Renee Mcdaniel who is 40 years old.  Her husband Renee Mcdaniel is a Microbiologist in the aluminum industry.  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  They attend church services at [employer]  in [place].  In her spare time Renee Mcdaniel enjoys gardening and attending church services.   ADVANCED DIRECTIVES: Not on file  HEALTH MAINTENANCE: Social History  Substance Use Topics  . Smoking status: Never Smoker   . Smokeless tobacco: Never Used  . Alcohol Use: No     Comment: Rarely    Colonoscopy: N/A PAP: 02/06/2013 Bone density: N/A Lipid panel: Not on file  Allergies  Allergen Reactions  . Amoxicillin-Pot Clavulanate Nausea And Vomiting  . Oxycodone Nausea Only    Current Outpatient Prescriptions  Medication Sig Dispense Refill  . alclomethasone (ACLOVATE) 0.05 % cream Apply 1 application topically daily as needed.     Marland Kitchen atorvastatin (LIPITOR) 10 MG tablet Take 10 mg by mouth daily.    . Diclofenac Potassium (CAMBIA) 50 MG PACK Take 1 each by mouth as needed.    Marland Kitchen levothyroxine (SYNTHROID, LEVOTHROID) 75 MCG tablet Take 75 mcg by mouth daily before breakfast.    . lidocaine (XYLOCAINE) 4 % external solution Apply topically as needed. Apply Lidocaine liquid 3 minutes before vaginal penetration. 50 mL 1  . MAXALT 10 MG tablet Take 10 mg by mouth as needed for migraine.     . meclizine (ANTIVERT) 32 MG tablet Take 32 mg by mouth daily as needed.      . Multiple Vitamins-Minerals (WOMENS MULTI VITAMIN & MINERAL PO) Take 1 tablet by mouth daily.    . OnabotulinumtoxinA, Cosmetic, (BOTOX COSMETIC IM) Inject 1 application into the muscle as needed (Migraine headaches).     . ondansetron (ZOFRAN) 4 MG tablet Take 4 mg by mouth as needed.      No current  facility-administered medications for this visit.    OBJECTIVE: Middle-aged white woman  who appears stated age 50 Vitals:   10/07/15 0921  BP: 106/64  Pulse: 70  Temp: 97.8 F (36.6 C)  Resp: 18     Body mass index is 37.74 kg/(m^2).      ECOG FS: 1 - Symptomatic but completely ambulatory  Sclerae unicteric, EOMs intact Oropharynx clear, dentition in good repair No cervical or supraclavicular adenopathy Lungs no rales or rhonchi Heart regular rate and rhythm Abd soft,  obese, nontender, positive bowel sounds , no hepatomegaly MSK no focal spinal tenderness, no upper extremity lymphedema Neuro: nonfocal, well oriented, appropriate affect Breasts:  The right breast is status post lumpectomy and radiation. There is no  evidence of local recurrence. The right axilla is benign. The left breast is status post reduction mammoplasty. In the superior aspect, upper-inner quadrant there is a very small, 1-2 mm firm nodule deep in the breast , and there are 2 similar nodules in the upper outer quadrant. There is no erythema. These are not tender to palpation. The left axilla is benign.  LAB RESULTS: Lab Results  Component Value Date   WBC 4.9 10/07/2015   NEUTROABS 2.6 10/07/2015   HGB 13.5 10/07/2015   HCT 41.1 10/07/2015   MCV 89.2 10/07/2015   PLT 169 10/07/2015      Chemistry      Component Value Date/Time   NA 141 10/07/2015 0908   NA 142 04/05/2012 1035   K 4.3 10/07/2015 0908   K 4.2 04/05/2012 1035   CL 105 04/05/2012 1035   CO2 27 10/07/2015 0908   CO2 28 04/05/2012 1035   BUN 12.2 10/07/2015 0908   BUN 10 04/05/2012 1035   CREATININE 0.9 10/07/2015 0908   CREATININE 0.87 04/05/2012 1035      Component Value Date/Time   CALCIUM 9.3 10/07/2015 0908   CALCIUM 9.4 04/05/2012 1035   ALKPHOS 94 10/07/2015 0908   ALKPHOS 105 04/05/2012 1035   AST 36* 10/07/2015 0908   AST 16 04/05/2012 1035   ALT 50 10/07/2015 0908   ALT 14 04/05/2012 1035   BILITOT 0.63  10/07/2015 0908   BILITOT 0.3 04/05/2012 1035      Lab Results  Component Value Date   LABCA2 20 04/05/2012    Urinalysis No results found for: COLORURINE  STUDIES: CLINICAL DATA: History of right breast cancer status post breast conservation therapy.  EXAM: DIGITAL DIAGNOSTIC BILATERAL MAMMOGRAM WITH 3D TOMOSYNTHESIS AND CAD  COMPARISON: Previous exam(s).  ACR Breast Density Category b: There are scattered areas of fibroglandular density.  FINDINGS: There are stable postsurgical changes within the right breast.  There are no new dominant masses, suspicious calcifications or secondary signs of malignancy identified in either breast.  Mammographic images were processed with CAD.  IMPRESSION: No mammographic evidence of malignancy. Stable postsurgical changes within the right breast.  RECOMMENDATION: Bilateral diagnostic mammogram in 1 year.  I have discussed the findings and recommendations with the patient. Results were also provided in writing at the conclusion of the visit. If applicable, a reminder letter will be sent to the patient regarding the next appointment.  BI-RADS CATEGORY 2: Benign.   Electronically Signed  By: Franki Cabot M.D.  On: 06/25/2015 12:01         ASSESSMENT: Renee Mcdaniel is a 50 y.o. BRCA negative, [place], [place] woman:  1.  Status post right breast needle core biopsy at the 10 o'clock position and right breast needle core biopsy of a lymph node at the 10 o'clock position on 02/07/2011 which showed in the right breast high-grade invasive ductal carcinoma in the lymph node benign lymph node negative for metastatic mammary carcinoma, estrogen receptor negative, progesterone receptor negative, Ki-67 77, HER-2/neu by CISH no amplification with a ratio of 1.54.  2.  The patient had a bilateral breast MRI on 02/10/2011 which showed in the right axilla, there was a 1.3 cm lymph node with thickened cortex and  apparent replacement of the fatty hilum.  This was more superior to the previously biopsied intramammary lymph node in the 10 o'clock location.  The biopsied right breast 10 o'clock location lymph node demonstrates internal clip artifact, without cortical thickening, and measured 0.8 cm maximally,  and demonstrated washin/washout type kinetics. Mild T2 - hyperintense post biopsy change noted in the 10 o'clock location of the right breast, at the site of biopsy-proven invasive ductal carcinoma. No other area of abnormal T2-weighted hyperintensity was seen on either side. There was an oval mildly irregular mass-like enhancement at the site of biopsy-proven breast cancer in the 10 o'clock location of the right breast, measuring 1.6 x 1.4 x 1.0 cm.  This demonstrated washin/washout kinetics.  Central clip artifact was noted. No other area of abnormal enhancement was seen in either breast (clinical stage I, T1 N0).  3.  The patient had genetics counseling and testing and a letter dated 02/27/2011 states the patient is negative for the BRCA1 and BRCA2 breast cancer genes.    4.  Status post right breast lumpectomy with right axillary sentinel node biopsy on 03/02/2011 for a stage I, pT1c pN0, 1.7 cm invasive ductal carcinoma (surgical resection margins negative for carcinoma), grade 3, estrogen receptor negative, progesterone receptor negative, Ki-67 77%, HER-2/neu by CISH no amplification detected, with 0/4 metastatic right axillary lymph nodes.    5.  The patient started adjuvant chemotherapy with TC (Taxotere/Cytoxan ) x 4 cycles from 04/06/2011 through 06/08/2011 with Neulasta support.  6.  The patient underwent radiation therapy from 07/06/2011 through 08/22/2011.  7.  Chronic migraine headaches (currently being treated with Botox injections at [facility] in [place], [place])  8.  Night sweats/hot flashes  9.  Vaginal dryness/dyspareunia   PLAN: Renee Mcdaniel  Is now nearly 5 years  out from her definitive surgery with no evidence of disease recurrence. This is very favorable.  She does have 2 small spots in the left breast, which likely are fat necrosis but which do need evaluation. I am setting her up for mammography with tomography within the next week to examine that.   Her transaminases are slightly elevated. This is a new finding. Most likely it is going to be due to medication or possibly to fatty liver. What we are going to do is repeat a liver panel in a month. If they have normalized that we'll take care of the issue, but otherwise we will set her up for an ultrasound of the liver at that point.    I have strongly encouraged her to start a walking program.  When I see her in October she will be discharged from follow-up. She understands we will still be available to see her at any point in the future if any new problem develops.  I did discuss our new survivorship program with her. I am scheduling her to meet with our survivorship nurse practitioner in May or June. That way Santos can tell  When she sees me in October whether she wishes to continue to participate in the survivorship program or simply be released.    she knows to call for any other issues that may develop before her next visit here. Chauncey Cruel, MD  10/07/2015, 9:46 AM

## 2015-10-12 ENCOUNTER — Ambulatory Visit
Admission: RE | Admit: 2015-10-12 | Discharge: 2015-10-12 | Disposition: A | Discharge status: Home / Self Care | Payer: BLUE CROSS/BLUE SHIELD | Source: Ambulatory Visit | Attending: Oncology | Admitting: Oncology

## 2015-10-12 DIAGNOSIS — C50411 Malignant neoplasm of upper-outer quadrant of right female breast: Secondary | ICD-10-CM

## 2015-11-04 ENCOUNTER — Other Ambulatory Visit (HOSPITAL_BASED_OUTPATIENT_CLINIC_OR_DEPARTMENT_OTHER): Payer: BLUE CROSS/BLUE SHIELD

## 2015-11-04 DIAGNOSIS — C50411 Malignant neoplasm of upper-outer quadrant of right female breast: Secondary | ICD-10-CM | POA: Diagnosis not present

## 2015-11-05 LAB — HEPATIC FUNCTION PANEL
ALBUMIN: 4.5 g/dL (ref 3.5–5.5)
ALK PHOS: 106 IU/L (ref 39–117)
ALT: 43 IU/L — ABNORMAL HIGH (ref 0–32)
AST: 30 IU/L (ref 0–40)
Bilirubin Total: 0.4 mg/dL (ref 0.0–1.2)
Bilirubin, Direct: 0.1 mg/dL (ref 0.00–0.40)
TOTAL PROTEIN: 6.5 g/dL (ref 6.0–8.5)

## 2015-11-11 ENCOUNTER — Telehealth: Payer: Self-pay | Admitting: *Deleted

## 2015-11-11 NOTE — Telephone Encounter (Signed)
Patient called to get lab results from 2/16. I gave her the results. She was told that she might need an ultrasound based on the results. She wants to know if she needs any further testing and would like a call back as soon as possible.

## 2015-11-12 ENCOUNTER — Other Ambulatory Visit: Payer: Self-pay | Admitting: Oncology

## 2015-11-12 DIAGNOSIS — K760 Fatty (change of) liver, not elsewhere classified: Secondary | ICD-10-CM

## 2015-11-12 DIAGNOSIS — C50411 Malignant neoplasm of upper-outer quadrant of right female breast: Secondary | ICD-10-CM

## 2015-11-18 ENCOUNTER — Ambulatory Visit (HOSPITAL_COMMUNITY)
Admission: RE | Admit: 2015-11-18 | Discharge: 2015-11-18 | Disposition: A | Discharge status: Home / Self Care | Payer: BLUE CROSS/BLUE SHIELD | Source: Ambulatory Visit | Attending: Oncology | Admitting: Oncology

## 2015-11-18 DIAGNOSIS — K760 Fatty (change of) liver, not elsewhere classified: Secondary | ICD-10-CM | POA: Diagnosis not present

## 2015-11-18 DIAGNOSIS — R945 Abnormal results of liver function studies: Secondary | ICD-10-CM | POA: Insufficient documentation

## 2015-11-18 DIAGNOSIS — C50411 Malignant neoplasm of upper-outer quadrant of right female breast: Secondary | ICD-10-CM | POA: Diagnosis not present

## 2015-11-19 ENCOUNTER — Other Ambulatory Visit: Payer: Self-pay | Admitting: Oncology

## 2015-11-19 DIAGNOSIS — C50411 Malignant neoplasm of upper-outer quadrant of right female breast: Secondary | ICD-10-CM

## 2015-11-19 NOTE — Progress Notes (Unsigned)
I called Renee Mcdaniel to discuss the results of her abdominal ultrasound, which are consistent with fatty liver. However it is not possible to entirely exclude cancer and radiologist suggests a liver MRI. We discussed the pros and cons of this and she is very clear that she wouldn't want to have this decided more definitively. We did discuss the test itself and I am going to go ahead and placed the order.  We also talked about management of fatty liver. She understands this is similar to pre-diabetes. I have recommended that she avoid carbohydrates, eating a lot of vegetables and meet, and I recommend that she participate either in the [place] program or the [facility], or both. I gave her the relevant numbers to call.  She will let me know if the MRI has not been scheduled before the end of next week.

## 2015-11-30 ENCOUNTER — Ambulatory Visit (HOSPITAL_COMMUNITY)
Admission: RE | Admit: 2015-11-30 | Discharge: 2015-11-30 | Disposition: A | Discharge status: Home / Self Care | Payer: BLUE CROSS/BLUE SHIELD | Source: Ambulatory Visit | Attending: Oncology | Admitting: Oncology

## 2015-11-30 DIAGNOSIS — C50411 Malignant neoplasm of upper-outer quadrant of right female breast: Secondary | ICD-10-CM | POA: Diagnosis not present

## 2015-11-30 DIAGNOSIS — K760 Fatty (change of) liver, not elsewhere classified: Secondary | ICD-10-CM | POA: Diagnosis not present

## 2015-11-30 MED ORDER — GADOBENATE DIMEGLUMINE 529 MG/ML IV SOLN
20.0000 mL | Freq: Once | INTRAVENOUS | Status: AC | PRN
  Administered 2015-11-30: 19 mL via INTRAVENOUS

## 2016-02-03 ENCOUNTER — Encounter: Payer: BLUE CROSS/BLUE SHIELD | Admitting: Nurse Practitioner

## 2016-02-23 ENCOUNTER — Ambulatory Visit (HOSPITAL_BASED_OUTPATIENT_CLINIC_OR_DEPARTMENT_OTHER): Payer: BLUE CROSS/BLUE SHIELD | Admitting: Nurse Practitioner

## 2016-02-23 ENCOUNTER — Encounter: Payer: Self-pay | Admitting: Nurse Practitioner

## 2016-02-23 VITALS — BP 128/86 | HR 88 | Temp 98.0°F | Resp 18 | Ht 62.0 in | Wt 206.6 lb

## 2016-02-23 DIAGNOSIS — Z853 Personal history of malignant neoplasm of breast: Secondary | ICD-10-CM

## 2016-02-23 DIAGNOSIS — N941 Unspecified dyspareunia: Secondary | ICD-10-CM | POA: Diagnosis not present

## 2016-02-23 DIAGNOSIS — C50411 Malignant neoplasm of upper-outer quadrant of right female breast: Secondary | ICD-10-CM

## 2016-02-23 NOTE — Progress Notes (Signed)
CLINIC:  Cancer Survivorship   REASON FOR VISIT:  Routine follow-up post-treatment for history of breast cancer.  BRIEF ONCOLOGIC HISTORY:    Breast cancer of upper-outer quadrant of right female breast ([place])   02/07/2011 Initial Biopsy Right breast needle core bx (10:00): invasive ductal carcinoma, 1.7 cm, grade 3, ER-, PR-, HER2/neu-, Ki67 77%; LN: benign   02/07/2011 Clinical Stage Stage IA: T1 N0   02/08/2011 Breast MRI Right axillary LN: 1.3 cm with thickened cortex and replacement of fatty hilum; more superior than previously sampled node; right breast mass with internal clip artifcal, measuring 0.8 cm maximally   02/27/2011 Procedure BRCA1/2 negative   03/02/2011 Definitive Surgery Right lumpectomy/SLNB: invasive ductal carcinoma, negative margins, grade 3, ER-, PR-, HER2/neu - , Ki67 77%, 0/4 LN   03/02/2011 Pathologic Stage Stage IA; pT1c pN0   04/06/2011 - 06/08/2011 Chemotherapy Adjuvant docetaxel and cyclophosphamide x 4    07/06/2011 - 08/22/2011 Radiation Therapy Adjuvant RT to right breast    INTERVAL HISTORY:  Renee Mcdaniel presents to the [facility] today for ongoing follow up regarding her history of breast cancer. Overall, Renee Mcdaniel reports feeling fairly well since her last visit with Dr. Jana Hakim in January 2017. She underwent U/S and MR of the abdomen to evaluate her elevated LFTs and these studies showed fatty liver.  She underwent left sided dx mammogram in January 2017 for palpable abnormalities, which showed likely oil cysts with repeat imaging recommended in October 2017. She has not noticed other changes within her breast.  She is scheduled to see Dr. Jana Hakim in October 2017.  She denies any headache, cough, shortness of breath, or bone pain. She reports a good appetite and denies any weight loss.  She has gained weight and is frustrated by this, despite making recent changes to her diet.  She does not exercise regularly.  She recently was placed on CPAP, which  has helped her awaken feeling more refreshed.  It also has given her more energy.  She continues with dyspareunia and vaginal dryness. She is considering [employer], but due to its expense, she is uncertain.  REVIEW OF SYSTEMS:  General: Weight gain. Denies fever, chills, unintentional weight loss, or generalized fatigue.  HEENT: Wears glasses. Denies visual changes, hearing loss, mouth sores, or difficulty swallowing. Cardiac: Denies palpitations and lower extremity edema.  Respiratory: Denies wheeze or dyspnea on exertion.  Breast: As above.  GI: Some constipation.  Denies abdominal pain,  diarrhea, nausea, or vomiting.  GU: Vaginal dryness and pain with intercourse.  Denies dysuria, hematuria, vaginal bleeding, vaginal discharge. Musculoskeletal: As above. Neuro: Denies recent fall or numbness / tingling in her extremities.  Skin: Denies rash, pruritis, or open wounds.  Psych: Denies depression, anxiety, insomnia, or memory loss.   A 14-point review of systems was completed and was negative, except as noted above.   ONCOLOGY TREATMENT TEAM:  1. Surgeon:  Dr. Donne Hazel at [facility]  2. Medical Oncologist: Dr. Jana Hakim 3. Radiation Oncologist: Dr. Pablo Ledger    PAST MEDICAL/SURGICAL HISTORY:  Past Medical History  Diagnosis Date  . Cancer (HCC)     rt. breast ca, stage I  . Vertigo   . Migraines     chronic  . Thyroid disease   . Hyperlipidemia   . Prediabetes    Past Surgical History  Procedure Laterality Date  . Breast surgery      s/p lumpectomy, sentinel node biopsy, port  . Cesarean section    . Tonsillectomy    .  Nasal septum surgery    . Port-a-cath removal       ALLERGIES:  Allergies  Allergen Reactions  . Amoxicillin-Pot Clavulanate Nausea And Vomiting  . Oxycodone Nausea Only     CURRENT MEDICATIONS:  Current Outpatient Prescriptions on File Prior to Visit  Medication Sig Dispense Refill  . alclomethasone (ACLOVATE) 0.05 % cream  Apply 1 application topically daily as needed.     Marland Kitchen atorvastatin (LIPITOR) 10 MG tablet Take 10 mg by mouth daily.    . candesartan (ATACAND) 4 MG tablet Take 4 mg by mouth daily.    . diazepam (VALIUM) 2 MG tablet Take 2 mg by mouth every 6 (six) hours as needed.    . Diclofenac Potassium (CAMBIA) 50 MG PACK Take 1 each by mouth as needed.    . Diclofenac Potassium 50 MG PACK Take by mouth.    . levothyroxine (SYNTHROID, LEVOTHROID) 75 MCG tablet Take 75 mcg by mouth daily before breakfast.    . lidocaine (XYLOCAINE) 4 % external solution Apply topically as needed. Apply Lidocaine liquid 3 minutes before vaginal penetration. 50 mL 1  . MAXALT 10 MG tablet Take 10 mg by mouth as needed for migraine.     . meclizine (ANTIVERT) 32 MG tablet Take 32 mg by mouth daily as needed.      . Multiple Vitamins-Minerals (WOMENS MULTI VITAMIN & MINERAL PO) Take 1 tablet by mouth daily.    . OnabotulinumtoxinA, Cosmetic, (BOTOX COSMETIC IM) Inject 1 application into the muscle as needed (Migraine headaches).     . ondansetron (ZOFRAN) 4 MG tablet Take 4 mg by mouth as needed. Reported on 02/23/2016     No current facility-administered medications on file prior to visit.     ONCOLOGIC FAMILY HISTORY:  Family History  Problem Relation Age of Onset  . Cancer Mother     breast  . Thyroid disease Sister   . Hyperlipidemia Sister   . Thyroid disease Sister   . Hyperlipidemia Sister      GENETIC COUNSELING/TESTING: Yes, performed 02/2011: BRCA1/BRCA2 negative.  SOCIAL HISTORY:  Renee Mcdaniel is married and lives with her family in [place], [place].  She has 2 children. Renee Mcdaniel is currently not working outside the home  She denies any current or history of tobacco, alcohol, or illicit drug use.     PHYSICAL EXAMINATION:  Vital Signs: Filed Vitals:   02/23/16 1313  BP: 128/86  Pulse: 88  Temp: 98 F (36.7 C)  Resp: 18   Weight: 206.6 (stable) ECOG performance status:  0 General: Well-nourished, well-appearing female in no acute distress.  She is accompanied in clinic by her daughter today.   HEENT: Head is atraumatic and normocephalic.  Pupils equal and reactive to light and accomodation. Conjunctivae clear without exudate.  Sclerae anicteric. Oral mucosa is pink, moist, and intact without lesions.  Oropharynx is pink without lesions or erythema.  Lymph: No cervical, supraclavicular, infraclavicular, or axillary lymphadenopathy noted on palpation.  Cardiovascular: Regular rate and rhythm without murmurs, rubs, or gallops. Respiratory: Clear to auscultation bilaterally. Chest expansion symmetric without accessory muscle use on inspiration or expiration.  Breast: Bilateral breast exam performed.  Right lumpectomy scar intact without nodularity or mass.  Left breast with stable nodu GI: Abdomen soft and round. No tenderness to palpation. Bowel sounds normoactive in 4 quadrants. No hepatosplenomegaly.   GU: Deferred.  Musculoskeletal: Muscle strength 5/5 in all extremities.   Neuro: No focal deficits. Steady gait.  Psych: Mood and  affect normal and appropriate for situation.  Extremities: No edema, cyanosis, or clubbing.  Skin: Warm and dry. No open lesions noted.   LABORATORY DATA:  No results found for this or any previous visit (from the past 2160 hour(s)).  DIAGNOSTIC IMAGING: Left diagnostic mammogram with ultrasound performed 10/12/2015 shows well circumscribed lucent centered masses most consistent with oil cysts.  Follow up study recommended in October 2017.  U/S of abdomen performed 11/18/2015 shows hepatic echotexture to be heterogenous and mildly increased.  Along the inferior surface of the liver, there is an area of slightly increased echoes. Suggestion of fatty liver; MR recommended.  MR of the liver performed 11/30/2015 shows heterogenous loss of signal intensity throughout the hepatic parenchyma, compatible with heterogenous hepatic steatosis, most  severe throughout the right liver lobe. Stable nonenhancing lesion in interpolar region of left kidney, consistent with cyst.     ASSESSMENT AND PLAN:   1. 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  Renee Mcdaniel is doing well with no clinical symptoms worrisome for cancer recurrence at this time. I have reviewed the recommendations for ongoing surveillance with her and she will follow-up with her medical oncologist,  Dr. Jana Hakim, in October 2017 with history and physical exam per surveillance protocol.  She will be due mammography in October 2017 and I have entered orders for this following today's appointment for this to be done prior to her visit with Dr. Jana Hakim.   She was instructed to make [place] aware if she notes any change within her breast, any new symptoms such as pain, shortness of breath, weight loss, or fatigue.  When she returns in October, she will discuss with Dr. Jana Hakim her subsequent follow up.  At this time, she is interested in continuing to be seen in the cancer center.  I have shared with her that we would be happy to see her in the Survivorship program if she so desires.      2. Weight gain: Renee Mcdaniel was encouraged to take part in our [facility] program as well as continue to modify her diet as she has begun. We discussed recommendations to lose weight, maximize nutrition and minimize recurrence, such as increased intake of fruits, vegetables, lean proteins, and minimizing the intake of red meats and processed foods.  She was also encouraged to engage in moderate to vigorous exercise for at least 30 minutes per day most days of the week. I believe that the [facility] program will help her  meet her goal.  She was instructed to limit her alcohol consumption and continue to abstain from tobacco use  3. 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  We discussed interventions such as vaginal moisturizers and lubricants that she should use regularly and I also offered her referral to our pelvic floor Physical Therapist, Earlie Counts. She is interested in this, provided it is covered by her insurance.  She has discussed the [place] procedure with Dr. Helane Rima, but at this time, it is too cost prohibitive.  She has a follow up appointment with Dr. Helane Rima next month.  We also discussed the possibility of estrogen replacement.  As she has hormone negative breast cancer,  this would not increase the risk of her cancer recurrence, although could minimally raise her risk for a hormonally negative breast cancer.  We will proceed with referral to [facility] at this time.    4. Cancer screening:  Due to Renee Mcdaniel's history and her age, she should receive screening for skin cancers, colon cancer, and gynecologic cancers.  The information and recommendations were shared with the patient and in her written after visit summary.  5. Support services/counseling: Renee Mcdaniel was offered support today through active listening and expressive supportive counseling.  She was congratulated on remaining cancer free for anthter year.  given information regarding our available services and encouraged to contact me with any questions or for help enrolling in any of our support group/programs.   A total of 35 minutes of face-to-face time was spent with this patient with greater than 50% of that time in counseling and care-coordination.   Renee Cheese, NP  Survivorship Program [facility] [phone]   Note: PRIMARY CARE PROVIDER Adin Hector,  [place] [phone]

## 2016-02-23 NOTE — Patient Instructions (Signed)
Thank you for coming in today!  As we discussed, please continue to perform your self breast exam and report any changes. If you note any new symptoms (please see below), be sure to notify us ASAP.  Your mammogram will be due in October 2017 and we will enter orders for it today.  You will see Dr. Jana Hakim in October 2017 for your next appointment or sooner if you have any problems. I will enter the referral to [facility] regarding the PT.  Keep your follow up with Dr. Helane Rima and contact [place] with [employer].  Good luck with your weight loss!  Looking forward to working with you in the future!  Let us know if you have any questions!  Symptoms to Watch for and Report to Your Provider  . Return of the cancer symptoms you had before- such as a lump or new growth where your cancer first started . New or unusual pain that seems unrelated to an injury and does not go away, including back pain or bone pain . Weight loss without trying/intending . Unexplained bleeding . A rash or allergic reaction, such as swelling, severe itching or wheezing . Chills or fevers . Persistent headaches . Shortness of breath or difficulty breathing . Bloody stools or blood in your urine . Lumps, bumps, swelling and/or nipple discharge . Nausea, vomiting, diarrhea, loss of appetite, or trouble swallowing . A cough that doesn't go away . Abdominal pain . Swelling in your arms or legs . Fractures . Hot flashes or other menopausal symptoms . Any other signs mentioned by your doctor or nurse or any unusual symptoms                 that you just can't explain   NOTE: Just because you have certain symptoms, it doesn't mean the cancer has come back or you have a new cancer. Symptoms can be due to other problems that need to be addressed.  It is important to watch for these symptoms and report them to your provider so you can be medically evaluated for any of these concerns!     Living a Life of Wellness After Cancer:  *Note:  Please consult your health care provider before using any medications, supplements, over-the-counter products, or other interventions.  Also, please consult your primary care provider before you begin any lifestyle program (diet, exercise, etc.).  Your safety is our top priority and we want to make sure you continue to live a long and healthy life!    Healthy Lifestyle Recommendations  As a cancer survivor, it is important develop a lifelong commitment to a healthy lifestyle. A healthy lifestyle can prevent cancer from returning as well as prevent other diseases like heart disease, diabetes and high blood pressure.  These are some things that you can do to have a healthy lifestyle:  Marland Kitchen Maintain a healthy weight.  . Exercise daily per your doctor's orders. . Eat a balanced diet high in fruits, vegetables, bran, and fiber. Limit intake of red meat      and processed foods.  . Limit how much alcohol you consume, if at all. Ali Lowe regular bone mineral density testing for osteoporosis.  . Talk to your doctor about cardiovascular disease or "heart disease" screening. . Stop smoking (if you smoke). . Know your family history. . Be mindful of your emotional, social, and spiritual needs. . Meet regularly with a Primary Care Provider (PCP). Find a PCP if you do not  already have one. . Talk to your doctor about regular cancer screening including screening for colon           cancer, GYN cancers, and skin cancer.

## 2016-02-23 NOTE — Progress Notes (Signed)
 CLINIC:  Cancer Survivorship   REASON FOR VISIT:  Routine follow-up post-treatment for history of breast cancer.  BRIEF ONCOLOGIC HISTORY:    Breast cancer of upper-outer quadrant of right female breast (HCC)   02/07/2011 Initial Biopsy Right breast needle core bx (10:00): invasive ductal carcinoma, 1.7 cm, grade 3, ER-, PR-, HER2/neu-, Ki67 77%; LN: benign   02/07/2011 Clinical Stage Stage IA: T1 N0   02/08/2011 Breast MRI Right axillary LN: 1.3 cm with thickened cortex and replacement of fatty hilum; more superior than previously sampled node; right breast mass with internal clip artifcal, measuring 0.8 cm maximally   02/27/2011 Procedure BRCA1/2 negative   03/02/2011 Definitive Surgery Right lumpectomy/SLNB: invasive ductal carcinoma, negative margins, grade 3, ER-, PR-, HER2/neu - , Ki67 77%, 0/4 LN   03/02/2011 Pathologic Stage Stage IA; pT1c pN0   04/06/2011 - 06/08/2011 Chemotherapy Adjuvant docetaxel and cyclophosphamide x 4    07/06/2011 - 08/22/2011 Radiation Therapy Adjuvant RT to right breast    INTERVAL HISTORY:  Renee Mcdaniel presents to the Survivorship Clinic today for ongoing follow up regarding her history of breast cancer. Overall, Renee Mcdaniel reports feeling fairly well since her last visit with Dr. Magrinat in January 2017. She underwent U/S and MR of the abdomen to evaluate her elevated LFTs and these studies showed fatty liver.  She underwent left sided dx mammogram in January 2017 for palpable abnormalities, which showed likely oil cysts with repeat imaging recommended in October 2017. She has not noticed other changes within her breast.  She is scheduled to see Dr. Magrinat in October 2017.  She denies any headache, cough, shortness of breath, or bone pain. She reports a good appetite and denies any weight loss.  She has gained weight and is frustrated by this, despite making recent changes to her diet.  She does not exercise regularly.  She recently was placed on CPAP, which  has helped her awaken feeling more refreshed.  It also has given her more energy.  She continues with dyspareunia and vaginal dryness. She is considering Mona Lisa Touch, but due to its expense, she is uncertain.  REVIEW OF SYSTEMS:  General: Weight gain. Denies fever, chills, unintentional weight loss, or generalized fatigue.  HEENT: Wears glasses. Denies visual changes, hearing loss, mouth sores, or difficulty swallowing. Cardiac: Denies palpitations and lower extremity edema.  Respiratory: Denies wheeze or dyspnea on exertion.  Breast: As above.  GI: Some constipation.  Denies abdominal pain,  diarrhea, nausea, or vomiting.  GU: Vaginal dryness and pain with intercourse.  Denies dysuria, hematuria, vaginal bleeding, vaginal discharge. Musculoskeletal: As above. Neuro: Denies recent fall or numbness / tingling in her extremities.  Skin: Denies rash, pruritis, or open wounds.  Psych: Denies depression, anxiety, insomnia, or memory loss.   A 14-point review of systems was completed and was negative, except as noted above.   ONCOLOGY TREATMENT TEAM:  1. Surgeon:  Dr. Wakefield at [facility]  2. Medical Oncologist: Dr. Magrinat 3. Radiation Oncologist: Dr. Wentworth    PAST MEDICAL/SURGICAL HISTORY:  Past Medical History  Diagnosis Date  . Cancer (HCC)     rt. breast ca, stage I  . Vertigo   . Migraines     chronic  . Thyroid disease   . Hyperlipidemia   . Prediabetes    Past Surgical History  Procedure Laterality Date  . Breast surgery      s/p lumpectomy, sentinel node biopsy, port  . Cesarean section    . Tonsillectomy    .   Nasal septum surgery    . Port-a-cath removal       ALLERGIES:  Allergies  Allergen Reactions  . Amoxicillin-Pot Clavulanate Nausea And Vomiting  . Oxycodone Nausea Only     CURRENT MEDICATIONS:  Current Outpatient Prescriptions on File Prior to Visit  Medication Sig Dispense Refill  . alclomethasone (ACLOVATE) 0.05 % cream  Apply 1 application topically daily as needed.     . atorvastatin (LIPITOR) 10 MG tablet Take 10 mg by mouth daily.    . candesartan (ATACAND) 4 MG tablet Take 4 mg by mouth daily.    . diazepam (VALIUM) 2 MG tablet Take 2 mg by mouth every 6 (six) hours as needed.    . Diclofenac Potassium (CAMBIA) 50 MG PACK Take 1 each by mouth as needed.    . Diclofenac Potassium 50 MG PACK Take by mouth.    . levothyroxine (SYNTHROID, LEVOTHROID) 75 MCG tablet Take 75 mcg by mouth daily before breakfast.    . lidocaine (XYLOCAINE) 4 % external solution Apply topically as needed. Apply Lidocaine liquid 3 minutes before vaginal penetration. 50 mL 1  . MAXALT 10 MG tablet Take 10 mg by mouth as needed for migraine.     . meclizine (ANTIVERT) 32 MG tablet Take 32 mg by mouth daily as needed.      . Multiple Vitamins-Minerals (WOMENS MULTI VITAMIN & MINERAL PO) Take 1 tablet by mouth daily.    . OnabotulinumtoxinA, Cosmetic, (BOTOX COSMETIC IM) Inject 1 application into the muscle as needed (Migraine headaches).     . ondansetron (ZOFRAN) 4 MG tablet Take 4 mg by mouth as needed. Reported on 02/23/2016     No current facility-administered medications on file prior to visit.     ONCOLOGIC FAMILY HISTORY:  Family History  Problem Relation Age of Onset  . Cancer Mother     breast  . Thyroid disease Sister   . Hyperlipidemia Sister   . Thyroid disease Sister   . Hyperlipidemia Sister      GENETIC COUNSELING/TESTING: Yes, performed 02/2011: BRCA1/BRCA2 negative.  SOCIAL HISTORY:  Renee Mcdaniel is married and lives with her family in , [place].  She has 2 children. Renee Mcdaniel is currently not working outside the home  She denies any current or history of tobacco, alcohol, or illicit drug use.     PHYSICAL EXAMINATION:  Vital Signs: Filed Vitals:   02/23/16 1313  BP: 128/86  Pulse: 88  Temp: 98 F (36.7 C)  Resp: 18   Weight: 206.6 (stable) ECOG performance status:  0 General: Well-nourished, well-appearing female in no acute distress.  She is accompanied in clinic by her daughter today.   HEENT: Head is atraumatic and normocephalic.  Pupils equal and reactive to light and accomodation. Conjunctivae clear without exudate.  Sclerae anicteric. Oral mucosa is pink, moist, and intact without lesions.  Oropharynx is pink without lesions or erythema.  Lymph: No cervical, supraclavicular, infraclavicular, or axillary lymphadenopathy noted on palpation.  Cardiovascular: Regular rate and rhythm without murmurs, rubs, or gallops. Respiratory: Clear to auscultation bilaterally. Chest expansion symmetric without accessory muscle use on inspiration or expiration.  Breast: Bilateral breast exam performed.  Right lumpectomy scar intact without nodularity or mass.  Left breast with stable nodu GI: Abdomen soft and round. No tenderness to palpation. Bowel sounds normoactive in 4 quadrants. No hepatosplenomegaly.   GU: Deferred.  Musculoskeletal: Muscle strength 5/5 in all extremities.   Neuro: No focal deficits. Steady gait.  Psych: Mood and   affect normal and appropriate for situation.  Extremities: No edema, cyanosis, or clubbing.  Skin: Warm and dry. No open lesions noted.   LABORATORY DATA:  No results found for this or any previous visit (from the past 2160 hour(s)).  DIAGNOSTIC IMAGING: Left diagnostic mammogram with ultrasound performed 10/12/2015 shows well circumscribed lucent centered masses most consistent with oil cysts.  Follow up study recommended in October 2017.  U/S of abdomen performed 11/18/2015 shows hepatic echotexture to be heterogenous and mildly increased.  Along the inferior surface of the liver, there is an area of slightly increased echoes. Suggestion of fatty liver; MR recommended.  MR of the liver performed 11/30/2015 shows heterogenous loss of signal intensity throughout the hepatic parenchyma, compatible with heterogenous hepatic steatosis, most  severe throughout the right liver lobe. Stable nonenhancing lesion in interpolar region of left kidney, consistent with cyst.     ASSESSMENT AND PLAN:   1. 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  Renee Mcdaniel is doing well with no clinical symptoms worrisome for cancer recurrence at this time. I have reviewed the recommendations for ongoing surveillance with her and she will follow-up with her medical oncologist,  Dr. Magrinat, in October 2017 with history and physical exam per surveillance protocol.  She will be due mammography in October 2017 and I have entered orders for this following today's appointment for this to be done prior to her visit with Dr. Magrinat.   She was instructed to make us aware if she notes any change within her breast, any new symptoms such as pain, shortness of breath, weight loss, or fatigue.  When she returns in October, she will discuss with Dr. Magrinat her subsequent follow up.  At this time, she is interested in continuing to be seen in the cancer center.  I have shared with her that we would be happy to see her in the Survivorship program if she so desires.      2. Weight gain: Renee Mcdaniel was encouraged to take part in our [employer] program as well as continue to modify her diet as she has begun. We discussed recommendations to lose weight, maximize nutrition and minimize recurrence, such as increased intake of fruits, vegetables, lean proteins, and minimizing the intake of red meats and processed foods.  She was also encouraged to engage in moderate to vigorous exercise for at least 30 minutes per day most days of the week. I believe that the [employer] program will help her  meet her goal.  She was instructed to limit her alcohol consumption and continue to abstain from tobacco use  3. 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  We discussed interventions such as vaginal moisturizers and lubricants that she should use regularly and I also offered her referral to our pelvic floor Physical Therapist, Cheryl Gray. She is interested in this, provided it is covered by her insurance.  She has discussed the Mona Lisa Touch procedure with Dr. Grewal, but at this time, it is too cost prohibitive.  She has a follow up appointment with Dr. Grewal next month.  We also discussed the possibility of estrogen replacement.  As she has hormone negative breast cancer,   this would not increase the risk of her cancer recurrence, although could minimally raise her risk for a hormonally negative breast cancer.  We will proceed with referral to Cheryl at this time.    4. Cancer screening:  Due to Renee Mcdaniel's history and her age, she should receive screening for skin cancers, colon cancer, and gynecologic cancers.  The information and recommendations were shared with the patient and in her written after visit summary.  5. Support services/counseling: Renee Mcdaniel was offered support today through active listening and expressive supportive counseling.  She was congratulated on remaining cancer free for anthter year.  given information regarding our available services and encouraged to contact me with any questions or for help enrolling in any of our support group/programs.   A total of 35 minutes of face-to-face time was spent with this patient with greater than 50% of that time in counseling and care-coordination.   Juron Vorhees Deike Cinderella Christoffersen, NP  Survivorship Program [facility] [phone]   Note: PRIMARY CARE PROVIDER BERT J KLEIN III,  MD [phone]    

## 2016-03-01 ENCOUNTER — Ambulatory Visit: Payer: BLUE CROSS/BLUE SHIELD | Attending: Nurse Practitioner | Admitting: Physical Therapy

## 2016-03-01 ENCOUNTER — Encounter: Payer: Self-pay | Admitting: Physical Therapy

## 2016-03-01 DIAGNOSIS — M6281 Muscle weakness (generalized): Secondary | ICD-10-CM | POA: Diagnosis not present

## 2016-03-01 DIAGNOSIS — M62838 Other muscle spasm: Secondary | ICD-10-CM

## 2016-03-01 NOTE — Therapy (Addendum)
[facility] [address] [place], [place], [address] Phone: [phone]   Fax:  [phone]  Physical Therapy Evaluation  Patient Details  Name: Renee Mcdaniel MRN: [id number] Date of Birth: [birth year] Referring Provider: Sylvan Cheese NP  Encounter Date: 03/01/2016      PT End of Session - 03/01/16 1704    Visit Number 1   Date for PT Re-Evaluation 07/01/16   Authorization Type BCBS   PT Start Time 1615   PT Stop Time 1700   PT Time Calculation (min) 45 min   Activity Tolerance Patient tolerated treatment well   Behavior During Therapy [facility] for tasks assessed/performed      Past Medical History  Diagnosis Date  . Cancer (HCC)     rt. breast ca, stage I  . Vertigo   . Migraines     chronic  . Thyroid disease   . Hyperlipidemia   . Prediabetes     Past Surgical History  Procedure Laterality Date  . Breast surgery      s/p lumpectomy, sentinel node biopsy, port  . Cesarean section    . Tonsillectomy    . Nasal septum surgery    . Port-a-cath removal      There were no vitals filed for this visit.       Subjective Assessment - 03/01/16 1621    Subjective Patient reports she is a breast cancer survivor in 5 years.  Patient is having painful sex with husband. pain with initial penetration. Patient looking into [place] . Patient was triple negative for breast cancer. Patient had chemotherapy and radiation for treatment. Lidocaine that helps some.    Patient Stated Goals reduce painwith intercourse   Currently in Pain? Yes   Pain Score 10-Worst pain ever   Pain Location Vagina   Pain Orientation Mid   Pain Descriptors / Indicators Aching;Burning;Sharp   Pain Type Chronic pain   Pain Onset More than a month ago   Pain Frequency Intermittent   Aggravating Factors  intercourse, vaginal exam    Pain Relieving Factors lidocaine   Multiple Pain Sites No            [facility] PT Assessment  - 03/01/16 0001    Assessment   Medical Diagnosis N94.10 Dyspareunia in female   Referring Provider Sylvan Cheese NP   Onset Date/Surgical Date 03/02/11   Next MD Visit 03/16/2016  appt. with gynocologist   Prior Therapy None   Precautions   Precautions Other (comment)   Precaution Comments breast cancer precautions   Restrictions   Weight Bearing Restrictions No   Balance Screen   Has the patient fallen in the past 6 months No   Has the patient had a decrease in activity level because of a fear of falling?  No   Is the patient reluctant to leave their home because of a fear of falling?  No   Home Ecologist residence   Prior Function   Level of Independence Independent   Vocation Other (comment)   Leisure gardening   Cognition   Overall Cognitive Status Within Functional Limits for tasks assessed   Observation/Other Assessments   Focus on Therapeutic Outcomes (FOTO)  46% limitation for urinary problems  goal is 37% limitation   Posture/Postural Control   Posture/Postural Control No significant limitations   ROM / Strength   AROM / PROM / Strength AROM;Strength   AROM   Overall AROM  Comments lumbar ROM is full; hip ROM is full   Strength   Overall Strength Comments bil. hip extension 4/5   Flexibility   Soft Tissue Assessment /Muscle Length yes   Hamstrings decreased bilaterally   Quadriceps decreased bilaterally   Piriformis decreased bilaterally                 Pelvic Floor Special Questions - 03/01/16 0001    Currently Sexually Active Yes   Marinoff Scale pain prevents any attempts at intercourse   Urinary Leakage Yes   How often 1 day/1 night   Pad use 2   Activities that cause leaking Sneezing;Coughing;With strong urge;Laughing;Lifting;Bending;Walking   Falling out feeling (prolapse) No   External Perineal Exam rash on the labia majora and inner thigh   Skin Integrity Erthema  in the vulva area   Prolapse  None   Pelvic Floor Internal Exam patient confirms identification and approves PT to assess muscle strength and integrity   Exam Type Vaginal   Palpation tenderness located on bil. obturator internist   Strength fair squeeze, definite lift                  PT Education - 03/01/16 1702    Education provided Yes   Education Details moisturizers, lubricants   Person(s) Educated Patient   Methods Explanation;Handout   Comprehension Verbalized understanding          PT Short Term Goals - 03/01/16 2000    PT SHORT TERM GOAL #1   Title independent with initial HEP with stretchind and diaphragmatic breathing   Time 4   Period Weeks   Status New   PT SHORT TERM GOAL #2   Title understand how to care for the vaginal area with cleaning   Time 4   Period Weeks   Status New   PT SHORT TERM GOAL #3   Title understand how to perform perineal massage to increase tissue mobility   Time 4   Period Weeks   Status New   PT SHORT TERM GOAL #4   Title urinary leakage during the day decreased >/= 25%   Time 4   Period Weeks   Status New   PT SHORT TERM GOAL #5   Title understand what bladder irritants are and how they affect the bladder.    Time 4   Period Weeks   Status New           PT Long Term Goals - 03/01/16 2003    PT LONG TERM GOAL #1   Title independent wtih HEP    Time 4   Period Months   Status New   PT LONG TERM GOAL #2   Title ability to have penile pentration with pain decreased >/= 75% and Marinoff score 1/3   Time 4   Period Weeks   Status New   PT LONG TERM GOAL #3   Title urinary leakage decreased >/= 60% with  all activities    Time 4   Period Months   Status New   PT LONG TERM GOAL #4   Title ability to have a vaginal exam due to pain decreased >/= 75%   Time 4   Period Weeks   Status New   PT LONG TERM GOAL #5   Title understand how to use relaxation and guided imagery to reduce pain and relax tissue   Time 4   Period Months   Status  New  Plan - 03/01/16 1947    Clinical Impression Statement Patient is a 50 year old female with diagnosis of drypareunia in female.  Patient has a history of breast cancer triple negative with treatment of radiation and chemotherapy.  Patient has a 10/10 pain vaginally with intercourse.  Patient reports increased burning and sharp pain with intercourse.Marinoff scale is 3/3.  Patient has increased discomfort with vaginal exams. Rash is present in the external pernieal that has been there for several weeks.  Patient reports urinary leakage with all activities and wears 1 pad during the day and night.  Patient feels the rash may be from the urinary leakage or pads.  Pelvic floor strength is 3/5.  Tenderness located in bilateral obturator internist and levator ani.  Decreased mobility of  of left urethra compressor. Patient is of low complexity.  Patient will benefit from  skilled therapy to reduce pain, improve tissue mobility and reduce urinary leakage.    Rehab Potential Excellent   Clinical Impairments Affecting Rehab Potential breast cancer triple negative; radiation and chemotherapy for treatment   PT Frequency 1x / week   PT Duration Other (comment)  4 months   PT Treatment/Interventions ADLs/Self Care Home Management;Biofeedback;Therapeutic activities;Therapeutic exercise;Patient/family education;Neuromuscular re-education;Manual techniques;Passive range of motion;Other (comment)  vaginal dilator   PT Next Visit Plan flexibility exercises, perineal massage; diaphragmatic breathing; pelvic drop; pelvic floor meditation; soft tissue work to perineum and thighs   PT Home Exercise Plan flexibility exericses; diaphragmatic breathing   Recommended Other Services Be assessed by gyneocologist on 03/16/2016.    Consulted and Agree with Plan of Care Patient      Patient will benefit from skilled therapeutic intervention in order to improve the following deficits and impairments:   Pain, Impaired flexibility, Increased fascial restricitons, Increased muscle spasms, Decreased activity tolerance, Decreased strength  Visit Diagnosis: Muscle weakness (generalized) - Plan: PT plan of care cert/re-cert  Other muscle spasm - Plan: PT plan of care cert/re-cert     Problem List Patient Active Problem List   Diagnosis Date Noted  . Migraines 10/07/2015  . Left breast mass 04/01/2015  . Dyspareunia 04/01/2015  . Breast cancer of upper-outer quadrant of right female breast ([place]) 03/24/2011    Earlie Counts, PT 03/01/2016 8:07 PM   [facility] [address] [place], [place], [address] Phone: [phone]   Fax:  [phone]  Name: Renee Mcdaniel MRN: [id number] Date of Birth: 03/23/1966  PHYSICAL THERAPY DISCHARGE SUMMARY  Visits from Start of Care: 1  Current functional level related to goals / functional outcomes: See above. Patient did not return after the initial evaluation.    Remaining deficits: See above.    Education / Equipment: HEP Plan:                                                    Patient goals were not met. Patient is being discharged due to not returning since the last visit.Thank you for the referral. Earlie Counts, PT 08/14/16 8:25 PM    ?????

## 2016-03-01 NOTE — Patient Instructions (Signed)
Moisturizers . They are used in the vagina to hydrate the mucous membrane that make up the vaginal canal. . Designed to keep a more normal acid balance (ph) . Once placed in the vagina, it will last between two to three days.  . Use 2-3 times per week at bedtime and last longer than 60 min. . Ingredients to avoid is glycerin and fragrance, can increase chance of infection . Should not be used just before sex due to causing irritation . Most are gels administered either in a tampon-shaped applicator or as a vaginal suppository. They are non-hormonal.   Types of Moisturizers . Replens- drug store . Samul Dada- drug store . Vitamin E vaginal suppositories- Whole foods . Moist Again . Coconut oil- can break down condoms  Things to avoid in the vaginal area . Do not use things to irritate the vulvar area . No lotions . No soaps; can use Aveeno or Calendula cleanser if needed. Must be gentle . No deodorants . No douches . Good to sleep without underwear to let the vaginal area to air out . No scrubbing: spread the lips to let warm water rinse over labias and pat dry   Lubrication . Used for intercourse to reduce friction . Avoid ones that have glycerin, warming gels, tingling gels, icing or cooling gel, scented . May need to be reapplied once or several times during sexual activity . Can be applied to both partners genitals prior to vaginal penetration to minimize friction or irritation . Prevent irritation and mucosal tears that cause post coital pain and increased the risk of vaginal and urinary tract infections . Oil-based lubricants cannot be used with condoms due to breaking them down.  Least likely to irritate vaginal tissue.  . Plant based-lubes are safe . Silicone-based lubrication are thicker and last long and used for post-menopausal women Types of Lubricants . Good Clean Love (water based)-[employer], [employer], [employer], [employer] . Slippery Stuff(water based) [employer] . Sylk (water  based) [employer], [place]- drug store; www.blossom-organics.com . Samul Dada- Drug store . Coconut oil- will breakdown condoms, least irritating . Aloe Vera- least irritating . Sliquid Natural H20 (water based)-[employer], good if frequent UTI's . Wet Platinum- (Silicone) Target, [employer] . Yes [place] . [place] Jelly, Replens, and Astroglide kills good Bacteria (lactobadilli)  Things to avoid in the vaginal area . Do not use things to irritate the vulvar area . No lotions . No soaps; can use Aveeno or Calendula cleanser if needed. Must be gentle . No deodorants  . No douches . Good to sleep without underwear to let the vaginal area to air out . No scrubbing: spread the lips to let warm water rinse over labias and pat dry  [facility] [address], [place], [place] [address] Phone # [phone] Fax [phone]

## 2016-03-27 ENCOUNTER — Encounter: Payer: BLUE CROSS/BLUE SHIELD | Admitting: Physical Therapy

## 2016-04-07 ENCOUNTER — Encounter: Payer: BLUE CROSS/BLUE SHIELD | Admitting: Physical Therapy

## 2016-04-14 ENCOUNTER — Ambulatory Visit: Payer: BLUE CROSS/BLUE SHIELD | Admitting: Physical Therapy

## 2016-04-20 ENCOUNTER — Encounter: Payer: BLUE CROSS/BLUE SHIELD | Admitting: Physical Therapy

## 2016-04-27 ENCOUNTER — Encounter: Payer: BLUE CROSS/BLUE SHIELD | Admitting: Physical Therapy

## 2016-06-26 ENCOUNTER — Other Ambulatory Visit: Payer: Self-pay | Admitting: Oncology

## 2016-06-26 DIAGNOSIS — Z853 Personal history of malignant neoplasm of breast: Secondary | ICD-10-CM

## 2016-06-26 DIAGNOSIS — Z9889 Other specified postprocedural states: Secondary | ICD-10-CM

## 2016-07-10 ENCOUNTER — Ambulatory Visit
Admission: RE | Admit: 2016-07-10 | Discharge: 2016-07-10 | Disposition: A | Discharge status: Home / Self Care | Payer: BLUE CROSS/BLUE SHIELD | Source: Ambulatory Visit | Attending: Oncology | Admitting: Oncology

## 2016-07-10 DIAGNOSIS — Z9889 Other specified postprocedural states: Secondary | ICD-10-CM

## 2016-07-10 DIAGNOSIS — Z853 Personal history of malignant neoplasm of breast: Secondary | ICD-10-CM

## 2016-07-13 ENCOUNTER — Ambulatory Visit: Payer: BLUE CROSS/BLUE SHIELD | Admitting: Oncology

## 2016-07-13 ENCOUNTER — Other Ambulatory Visit: Payer: BLUE CROSS/BLUE SHIELD

## 2016-08-02 ENCOUNTER — Other Ambulatory Visit (HOSPITAL_BASED_OUTPATIENT_CLINIC_OR_DEPARTMENT_OTHER): Payer: BLUE CROSS/BLUE SHIELD

## 2016-08-02 ENCOUNTER — Ambulatory Visit (HOSPITAL_BASED_OUTPATIENT_CLINIC_OR_DEPARTMENT_OTHER): Payer: BLUE CROSS/BLUE SHIELD | Admitting: Oncology

## 2016-08-02 VITALS — BP 102/71 | HR 82 | Temp 98.1°F | Resp 17 | Ht 62.0 in | Wt 199.2 lb

## 2016-08-02 DIAGNOSIS — C50411 Malignant neoplasm of upper-outer quadrant of right female breast: Secondary | ICD-10-CM

## 2016-08-02 DIAGNOSIS — Z171 Estrogen receptor negative status [ER-]: Principal | ICD-10-CM

## 2016-08-02 DIAGNOSIS — Z853 Personal history of malignant neoplasm of breast: Secondary | ICD-10-CM | POA: Diagnosis not present

## 2016-08-02 LAB — CBC WITH DIFFERENTIAL/PLATELET
BASO%: 0.5 % (ref 0.0–2.0)
Basophils Absolute: 0 10*3/uL (ref 0.0–0.1)
EOS%: 1 % (ref 0.0–7.0)
Eosinophils Absolute: 0.1 10*3/uL (ref 0.0–0.5)
HCT: 42.4 % (ref 34.8–46.6)
HGB: 14.1 g/dL (ref 11.6–15.9)
LYMPH%: 34.1 % (ref 14.0–49.7)
MCH: 29.7 pg (ref 25.1–34.0)
MCHC: 33.3 g/dL (ref 31.5–36.0)
MCV: 89.3 fL (ref 79.5–101.0)
MONO#: 0.5 10*3/uL (ref 0.1–0.9)
MONO%: 7.9 % (ref 0.0–14.0)
NEUT#: 3.3 10*3/uL (ref 1.5–6.5)
NEUT%: 56.5 % (ref 38.4–76.8)
Platelets: 187 10*3/uL (ref 145–400)
RBC: 4.75 10*6/uL (ref 3.70–5.45)
RDW: 13.4 % (ref 11.2–14.5)
WBC: 5.8 10*3/uL (ref 3.9–10.3)
lymph#: 2 10*3/uL (ref 0.9–3.3)

## 2016-08-02 LAB — COMPREHENSIVE METABOLIC PANEL
ALT: 31 U/L (ref 0–55)
ANION GAP: 9 meq/L (ref 3–11)
AST: 23 U/L (ref 5–34)
Albumin: 4 g/dL (ref 3.5–5.0)
Alkaline Phosphatase: 102 U/L (ref 40–150)
BUN: 13.4 mg/dL (ref 7.0–26.0)
CHLORIDE: 108 meq/L (ref 98–109)
CO2: 26 meq/L (ref 22–29)
CREATININE: 0.9 mg/dL (ref 0.6–1.1)
Calcium: 9.8 mg/dL (ref 8.4–10.4)
EGFR: 80 mL/min/{1.73_m2} — ABNORMAL LOW (ref >90)
GLUCOSE: 90 mg/dL (ref 70–140)
Potassium: 4.3 mEq/L (ref 3.5–5.1)
SODIUM: 142 meq/L (ref 136–145)
Total Bilirubin: 0.44 mg/dL (ref 0.20–1.20)
Total Protein: 6.6 g/dL (ref 6.4–8.3)

## 2016-08-02 NOTE — Progress Notes (Signed)
[place]  [phone] [phone]  OFFICE PROGRESS NOTE   ID: Verne Spurr   DOB: May 27, 1966  MR#: [id number]  VQX#:[id number]   PCP: Adin Hector, MD III, MD GYN: Erik Obey. Helane Rima, MD. SU: Rolm Bookbinder, MD  RAD ONC: Thea Silversmith, MD  CHIEF COMPLAINT: triple negative breast cancer  CURRENT TREATMENT: observation  BREAST CANCER HISTORY: From Dr. Collier Salina Rubin's new patient evaluation note dated 02/15/2011:  "This is a pleasant 50 year old woman who had previously been in good health.  She had noted a mass in her right breast and sought medical attention for this.  A mammogram performed showed a mass in the right breast at 10 o'clock position.  Ultrasound confirmed the presence of a 1 x 0.9 x 1.7-cm mass.  This was at 10 o'clock an intramammary lymph node seen as well.  Subsequent biopsy performed on 5/22 showed a high-grade invasive ductal cancer.  The associated intramammary lymph node was negative.  The invasive tumor was triple negative with a HER-2 ratio of 1.54 and had a proliferative index of 77%.  She also had an MRI scan performed which confirmed the presence of a mass measuring 1.6 x 1.4 x 1 cm.  There are no other masses appreciated.  The patient was seen in the Multidisciplinary Clinic."  In summary: this 50 year old BRCA negative [place], [place] woman establish herself in my practice today. We reviewed her diagnosis, treatment history, and prognosis. In brief:  She underwent right breast upper outer quadrant lumpectomy and sentinel lymph node sampling June 2012 for a pT1c pN0, stage IA invasive ductal carcinoma, grade 3, triple negative, with an MIB-1 of 77%. Adjuvantly she received docetaxel and cyclophosphamide x4 completed in September of 2012, followed by adjuvant radiation completed in December of 2012.    Her subsequent history is as stated below.   INTERVAL HISTORY: Braidyn returns today for follow up of her triple  negative breast cancer. She is working only part time and very sporadically, and doesn't really want to get back to work full time. She is spending more time exercising, meaning walking, dancing, and doing some aerobics under a 4-3-2 -1  program. She has managed to lose about 10 pounds which is in achievement since she is going through menopause at the same time.  REVIEW OF SYSTEMS: Teiara  is sleeping better now that she has CPAP and also because she is exercising. She has problems with vaginal dryness and underwent the [facility] touch treatments, which is making things a little better --she hasn't quite had the fourth treatment yet so she is not quite sure how much benefit she swill get. Hot flashes are better. A detailed review of systems today was otherwise stable   PAST MEDICAL HISTORY: Past Medical History:  Diagnosis Date  . Cancer ([place])    rt. breast ca, stage I  . Hyperlipidemia   . Migraines    chronic  . Prediabetes   . Thyroid disease   . Vertigo     PAST SURGICAL HISTORY: Past Surgical History:  Procedure Laterality Date  . BREAST SURGERY     s/p lumpectomy, sentinel node biopsy, port  . CESAREAN SECTION    . NASAL SEPTUM SURGERY    . PORT-A-CATH REMOVAL    . TONSILLECTOMY      FAMILY HISTORY Family History  Problem Relation Age of Onset  . Cancer Mother     breast  . Thyroid disease Sister   . Hyperlipidemia Sister   .  Thyroid disease Sister   . Hyperlipidemia Sister   She had a mother with breast cancer at 47.  No other family history of breast, colon or ovarian cancer.   GYNECOLOGIC HISTORY: Gravida 2, para 1, one miscarriage, menarche at 17.  She has only had one menses in 10/2012 since undergoing chemotherapy in 2012.  She had been on hormone replacement with the NuvaRing on and off for a span of 2 years and this happened over 10 years ago.  She used birth control pills for 2 years from 43 through 1985.  SOCIAL HISTORY: Mrs. Sunga and her husband have  been married since 1986.  They have a daughter who is in her 18s and who is going to get married in November. They also have an adopted daughter from [place] named Bairdford who is 58 years old.  Her husband Kiearra Oyervides is a Microbiologist in the aluminum industry.  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  They attend church services at [employer]  in [place], [place].  In her spare time Mrs. Willis enjoys gardening and attending church services.   ADVANCED DIRECTIVES: Not on file  HEALTH MAINTENANCE: Social History  Substance Use Topics  . Smoking status: Never Smoker  . Smokeless tobacco: Never Used  . Alcohol use No     Comment: Rarely    Colonoscopy: N/A PAP: 02/06/2013 Bone density: N/A Lipid panel: Not on file  Allergies  Allergen Reactions  . Amoxicillin-Pot Clavulanate Nausea And Vomiting  . Oxycodone Nausea Only    Current Outpatient Prescriptions  Medication Sig Dispense Refill  . alclomethasone (ACLOVATE) 0.05 % cream Apply 1 application topically daily as needed.     Marland Kitchen atorvastatin (LIPITOR) 10 MG tablet Take 10 mg by mouth daily.    . candesartan (ATACAND) 4 MG tablet Take 4 mg by mouth daily.    . diazepam (VALIUM) 2 MG tablet Take 2 mg by mouth every 6 (six) hours as needed.    . Diclofenac Potassium (CAMBIA) 50 MG PACK Take 1 each by mouth as needed.    Marland Kitchen levothyroxine (SYNTHROID, LEVOTHROID) 75 MCG tablet Take 75 mcg by mouth daily before breakfast.    . lidocaine (XYLOCAINE) 4 % external solution Apply topically as needed. Apply Lidocaine liquid 3 minutes before vaginal penetration. 50 mL 1  . MAXALT 10 MG tablet Take 10 mg by mouth as needed for migraine.     . meclizine (ANTIVERT) 32 MG tablet Take 32 mg by mouth daily as needed.      . Multiple Vitamins-Minerals (WOMENS MULTI VITAMIN & MINERAL PO) Take 1 tablet by mouth daily.    . OnabotulinumtoxinA, Cosmetic, (BOTOX COSMETIC IM) Inject 1 application into the muscle as needed (Migraine headaches).     .  ondansetron (ZOFRAN) 4 MG tablet Take 4 mg by mouth as needed. Reported on 02/23/2016     No current facility-administered medications for this visit.     OBJECTIVE: Middle-aged white woman  Vitals:   08/02/16 1300  BP: 102/71  Pulse: 82  Resp: 17  Temp: 98.1 F (36.7 C)     Body mass index is 36.43 kg/m.      ECOG FS: 1 - Symptomatic but completely ambulatory  Sclerae unicteric, pupils round and equal Oropharynx clear and moist-- no thrush or other lesions No cervical or supraclavicular adenopathy Lungs no rales or rhonchi Heart regular rate and rhythm Abd soft, nontender, positive bowel sounds MSK no focal spinal tenderness, no upper extremity lymphedema Neuro: nonfocal,  well oriented, appropriate affect Breasts: The right breast is status post lumpectomy and radiation. There is no evidence of local recurrence. The right axilla is benign. Left breast is unremarkable    LAB RESULTS: Lab Results  Component Value Date   WBC 5.8 08/02/2016   NEUTROABS 3.3 08/02/2016   HGB 14.1 08/02/2016   HCT 42.4 08/02/2016   MCV 89.3 08/02/2016   PLT 187 08/02/2016      Chemistry      Component Value Date/Time   NA 142 08/02/2016 1225   K 4.3 08/02/2016 1225   CL 105 04/05/2012 1035   CO2 26 08/02/2016 1225   BUN 13.4 08/02/2016 1225   CREATININE 0.9 08/02/2016 1225      Component Value Date/Time   CALCIUM 9.8 08/02/2016 1225   ALKPHOS 102 08/02/2016 1225   AST 23 08/02/2016 1225   ALT 31 08/02/2016 1225   BILITOT 0.44 08/02/2016 1225      Lab Results  Component Value Date   LABCA2 20 04/05/2012    Urinalysis No results found for: COLORURINE  STUDIES: Mm Diag Breast Tomo Bilateral  Result Date: 07/10/2016 CLINICAL DATA:  History of malignant lumpectomy of the right breast in 2012 followed by chemotherapy and radiation therapy. Followup evaluation. EXAM: 2D DIGITAL DIAGNOSTIC BILATERAL MAMMOGRAM WITH CAD AND ADJUNCT TOMO COMPARISON:  Previous exam(s). ACR Breast  Density Category b: There are scattered areas of fibroglandular density. FINDINGS: There is no specific evidence for recurrent tumor or developing malignancy. There are stable scarring changes located within the upper-outer quadrant of the right breast related to the patient's lumpectomy. The breast parenchymal pattern is stable bilaterally. Mammographic images were processed with CAD. IMPRESSION: No findings worrisome for recurrent tumor or developing malignancy. RECOMMENDATION: Bilateral diagnostic mammography in 1 year. I have discussed the findings and recommendations with the patient. Results were also provided in writing at the conclusion of the visit. If applicable, a reminder letter will be sent to the patient regarding the next appointment. BI-RADS CATEGORY  2: Benign. Electronically Signed   By: Altamese Cabal M.D.   On: 07/10/2016 09:43    ASSESSMENT: Ms. Dougher is a 50 y.o. BRCA negative, [place], [place] woman:  1.  Status post right breast Upper outer quadrant needle core biopsy at the 10 o'clock position and right breast needle core biopsy of a lymph node at the 10 o'clock position on 02/07/2011 which showed in the right breast high-grade invasive ductal carcinoma in the lymph node benign lymph node negative for metastatic mammary carcinoma, estrogen receptor negative, progesterone receptor negative, Ki-67 77, HER-2/neu by CISH no amplification with a ratio of 1.54.  2.  The patient had a bilateral breast MRI on 02/10/2011 which showed in the right axilla, there was a 1.3 cm lymph node with thickened cortex and apparent replacement of the fatty hilum.  This was more superior to the previously biopsied intramammary lymph node in the 10 o'clock location.  The biopsied right breast 10 o'clock location lymph node demonstrates internal clip artifact, without cortical thickening, and measured 0.8 cm maximally, and demonstrated washin/washout type kinetics. Mild T2 - hyperintense post  biopsy change noted in the 10 o'clock location of the right breast, at the site of biopsy-proven invasive ductal carcinoma. No other area of abnormal T2-weighted hyperintensity was seen on either side. There was an oval mildly irregular mass-like enhancement at the site of biopsy-proven breast cancer in the 10 o'clock location of the right breast, measuring 1.6 x 1.4 x 1.0 cm.  This demonstrated washin/washout kinetics.  Central clip artifact was noted. No other area of abnormal enhancement was seen in either breast (clinical stage I, T1 N0).  3.  The patient had genetics counseling and testing and a letter dated 02/27/2011 states the patient is negative for the BRCA1 and BRCA2 breast cancer genes.    4.  Status post right breast lumpectomy with right axillary sentinel node biopsy on 03/02/2011 for a stage I, pT1c pN0, 1.7 cm invasive ductal carcinoma (surgical resection margins negative for carcinoma), grade 3, estrogen receptor negative, progesterone receptor negative, Ki-67 77%, HER-2/neu by CISH no amplification detected, with 0/4 metastatic right axillary lymph nodes.    5.  The patient started adjuvant chemotherapy with TC (Taxotere/Cytoxan ) x 4 cycles from 04/06/2011 through 06/08/2011 with Neulasta support.  6.  The patient underwent radiation therapy from 07/06/2011 through 08/22/2011.  7.  Chronic migraine headaches (currently being treated with Botox injections at [facility] in [place], [place])  8.  Night sweats/hot flashes  9.  Vaginal dryness/dyspareunia  10. Hepatic steatosis documented by liver MRI 11/30/2015.   PLAN: Anael  Is now 5-1/2 years out from definitive surgery for her breast cancer with no evidence of disease recurrence. This is very favorable.  We reviewed the fact that estrogen receptor negative tumors, if we are going to recur, tend to recur early. Accordingly I am comfortable releasing her to her primary care physician at this  time.  We discussed our survivorship program and she appreciated the visit but she does not feel she needs that level of support at this point.  All she will need in terms of breast cancer screening is yearly mammography and a yearly physician breast I will be glad to see Laporcha again at any point in the future if on when the need arises, but as of now are making a further routine appointments for    Chauncey Cruel, MD  08/02/2016, 1:22 PM

## 2017-06-07 ENCOUNTER — Other Ambulatory Visit: Payer: Self-pay | Admitting: Obstetrics and Gynecology

## 2017-06-07 DIAGNOSIS — N6310 Unspecified lump in the right breast, unspecified quadrant: Secondary | ICD-10-CM

## 2017-06-13 ENCOUNTER — Ambulatory Visit
Admission: RE | Admit: 2017-06-13 | Discharge: 2017-06-13 | Disposition: A | Discharge status: Home / Self Care | Payer: BLUE CROSS/BLUE SHIELD | Source: Ambulatory Visit | Attending: Obstetrics and Gynecology | Admitting: Obstetrics and Gynecology

## 2017-06-13 DIAGNOSIS — N6310 Unspecified lump in the right breast, unspecified quadrant: Secondary | ICD-10-CM

## 2017-06-13 HISTORY — DX: Personal history of irradiation: Z92.3

## 2017-06-13 HISTORY — DX: Malignant neoplasm of unspecified site of unspecified female breast: C50.919

## 2017-06-28 ENCOUNTER — Other Ambulatory Visit: Payer: Self-pay | Admitting: Obstetrics and Gynecology

## 2017-06-28 DIAGNOSIS — Z853 Personal history of malignant neoplasm of breast: Secondary | ICD-10-CM

## 2017-07-16 ENCOUNTER — Other Ambulatory Visit: Payer: BLUE CROSS/BLUE SHIELD

## 2017-07-16 ENCOUNTER — Encounter: Payer: BLUE CROSS/BLUE SHIELD | Admitting: Genetic Counselor

## 2017-07-18 ENCOUNTER — Other Ambulatory Visit: Payer: BLUE CROSS/BLUE SHIELD

## 2017-07-18 ENCOUNTER — Encounter: Payer: BLUE CROSS/BLUE SHIELD | Admitting: Genetic Counselor

## 2017-08-07 ENCOUNTER — Ambulatory Visit (HOSPITAL_BASED_OUTPATIENT_CLINIC_OR_DEPARTMENT_OTHER): Payer: BLUE CROSS/BLUE SHIELD | Admitting: Genetics

## 2017-08-07 ENCOUNTER — Ambulatory Visit
Admission: RE | Admit: 2017-08-07 | Discharge: 2017-08-07 | Disposition: A | Discharge status: Home / Self Care | Payer: BLUE CROSS/BLUE SHIELD | Source: Ambulatory Visit | Attending: Obstetrics and Gynecology | Admitting: Obstetrics and Gynecology

## 2017-08-07 ENCOUNTER — Other Ambulatory Visit: Payer: BLUE CROSS/BLUE SHIELD

## 2017-08-07 ENCOUNTER — Encounter: Payer: Self-pay | Admitting: Genetics

## 2017-08-07 DIAGNOSIS — Z807 Family history of other malignant neoplasms of lymphoid, hematopoietic and related tissues: Secondary | ICD-10-CM | POA: Diagnosis not present

## 2017-08-07 DIAGNOSIS — Z853 Personal history of malignant neoplasm of breast: Secondary | ICD-10-CM

## 2017-08-07 DIAGNOSIS — C50411 Malignant neoplasm of upper-outer quadrant of right female breast: Secondary | ICD-10-CM

## 2017-08-07 DIAGNOSIS — Z803 Family history of malignant neoplasm of breast: Secondary | ICD-10-CM | POA: Insufficient documentation

## 2017-08-07 DIAGNOSIS — Z801 Family history of malignant neoplasm of trachea, bronchus and lung: Secondary | ICD-10-CM

## 2017-08-07 DIAGNOSIS — Z171 Estrogen receptor negative status [ER-]: Secondary | ICD-10-CM | POA: Diagnosis not present

## 2017-08-07 NOTE — Progress Notes (Signed)
REFERRING PROVIDER: Dian Queen, [address] [place], [place] [address]  PRIMARY PROVIDER:  Adin Hector, MD  PRIMARY REASON FOR VISIT:  1. Malignant neoplasm of upper-outer quadrant of right breast in female, estrogen receptor negative ([place])   2. Family history of breast cancer   3. Family history of multiple myeloma   4. Family history of lung cancer      HISTORY OF PRESENT ILLNESS:   Renee Mcdaniel, a 51 y.o. female, was seen for a [facility] cancer genetics consultation at the request of Dr. Helane Rima due to a personal and family history of cancer.  Renee Mcdaniel presents to clinic today to discuss the possibility of a hereditary predisposition to cancer, genetic testing, and to further clarify her future cancer risks, as well as potential cancer risks for family members.   In 2012, at the age of 43, Renee Mcdaniel was diagnosed with invasive ductal carcinoma of the right breast (Triple negative). This was treated with lumpectomy followed by adjuvant chemotherapy and radiation.  In 2012 She had genetic testing that included sequencing and large rearrangement analysis of the BRCA1 and BRCA2 genes.  This test did not include analysis of any other genes.      CANCER HISTORY:    Breast cancer of upper-outer quadrant of right female breast ([place])   02/07/2011 Initial Biopsy    Right breast needle core bx (10:00): invasive ductal carcinoma, 1.7 cm, grade 3, ER-, PR-, HER2/neu-, Ki67 77%; LN: benign      02/07/2011 Clinical Stage    Stage IA: T1 N0      02/08/2011 Breast MRI    Right axillary LN: 1.3 cm with thickened cortex and replacement of fatty hilum; more superior than previously sampled node; right breast mass with internal clip artifcal, measuring 0.8 cm maximally      02/27/2011 Procedure    BRCA1/2 negative      03/02/2011 Definitive Surgery    Right lumpectomy/SLNB: invasive ductal carcinoma, negative margins, grade 3, ER-, PR-, HER2/neu - , Ki67  77%, 0/4 LN      03/02/2011 Pathologic Stage    Stage IA; pT1c pN0      04/06/2011 - 06/08/2011 Chemotherapy    Adjuvant docetaxel and cyclophosphamide x 4       07/06/2011 - 08/22/2011 Radiation Therapy    Adjuvant RT to right breast        HORMONAL RISK FACTORS:  Menarche was at age 26.  OCP use for approximately 2 years.  Ovaries intact: yes.  Hysterectomy: no.  HRT use: nuva ring years. Colonoscopy: no, ; scheduled for next week. Mammogram within the last year: yes.  Past Medical History:  Diagnosis Date  . Breast cancer ([place])   . Cancer ([place])    rt. breast ca, stage I  . Family history of breast cancer   . Family history of lung cancer   . Family history of multiple myeloma   . Hyperlipidemia   . Migraines    chronic  . Personal history of radiation therapy   . Prediabetes   . Thyroid disease   . Vertigo     Past Surgical History:  Procedure Laterality Date  . BREAST LUMPECTOMY Right    2012  . BREAST SURGERY     s/p lumpectomy, sentinel node biopsy, port  . CESAREAN SECTION    . NASAL SEPTUM SURGERY    . PORT-A-CATH REMOVAL    . REDUCTION MAMMAPLASTY     left  .  TONSILLECTOMY      Social History   Socioeconomic History  . Marital status: Married    Spouse name: None  . Number of children: None  . Years of education: None  . Highest education level: None  Social Needs  . Financial resource strain: None  . Food insecurity - worry: None  . Food insecurity - inability: None  . Transportation needs - medical: None  . Transportation needs - non-medical: None  Occupational History  . None  Tobacco Use  . Smoking status: Never Smoker  . Smokeless tobacco: Never Used  Substance and Sexual Activity  . Alcohol use: No    Comment: Rarely  . Drug use: No  . Sexual activity: Yes    Birth control/protection: None  Other Topics Concern  . None  Social History Narrative  . None     FAMILY HISTORY:  We obtained a detailed, 4-generation family  history.  Significant diagnoses are listed below: Family History  Problem Relation Age of Onset  . Breast cancer Mother 62  . Thyroid disease Sister   . Hyperlipidemia Sister   . Thyroid disease Sister   . Hyperlipidemia Sister   . Cancer Maternal Uncle 3       type unknown  . Heart disease Maternal Grandfather        died in 70's  . Cancer Paternal Grandmother        type unk, age dx unk  . Multiple myeloma Paternal Grandfather 76  . Lung cancer Cousin    Renee Mcdaniel has a 30 year-old biological daughter and an adopted daughter.  Renee Mcdaniel has 2 full sisters and 2 full brothers with no history of cancer.  Her full sisters have children, none with any history of cancer.  Renee Mcdaniel also has 2 maternal half-sisters with no history of cancer.  These half-sisters have children with no history of cancer.   Renee Mcdaniel's father died in his 56's due to an accident.  He had several siblings, none with any history of cancer.  One of Renee Mcdaniel's paternal aunts had a son who was recently diagnosed with late stage lung cancer.  Her other paternal cousins have no history of cancer.  Renee Mcdaniel's paternal grandfather died in his 2's/70's due to multiple myeloma/lymphoma.  Her paternal grandmother died due to cancer- type and age of dx is unknown.  She died before Renee Mcdaniel was born so she has little information about this grandmother.    Renee Mcdaniel's mother died at 66 and had a history of breast cancer diagnosed at the age of 44.  Renee Mcdaniel had a maternal uncle who died at the age of 3 due to an unknown type of cancer. She also has a maternal aunt or uncle who died at 24 days old (cause unk).  Renee Mcdaniel maternal grandfather died in hs 74's and had heart disease.  Her maternal grandmother died in her 96'E due to complications after a fall.    Renee Mcdaniel reports that no other relatives in her family have had any problems with several colon polyps or breast biopsies that she is  aware of   Renee Mcdaniel is unaware of of previous family history of genetic testing for hereditary cancer risks. Patient's maternal ancestors are of [place] descent, and paternal ancestors are of English descent. There is no reported Ashkenazi Jewish ancestry. There is no known consanguinity.  GENETIC COUNSELING ASSESSMENT: Renee Mcdaniel is a 51 y.o. female with a personal and family  history which is somewhat suggestive of a Hereditary Cancer Predisposition Syndrome. We, therefore, discussed and recommended the following at today's visit.   DISCUSSION: We reviewed the characteristics, features and inheritance patterns of hereditary cancer syndromes. We also discussed genetic testing, including the appropriate family members to test, the process of testing, insurance coverage and turn-around-time for results. We discussed the implications of a negative, positive and/or variant of uncertain significant result. We recommended Renee Mcdaniel pursue genetic testing for the Common Hereditary Cancer Panel.   The Hereditary Gene Panel offered by [facility] includes sequencing and/or deletion duplication testing of the following 47 genes: APC, ATM, AXIN2, BARD1, BMPR1A, BRCA1, BRCA2, BRIP1, CDH1, CDKN2A (p14ARF), CDKN2A (p16INK4a), CKD4, CHEK2, CTNNA1, DICER1, EPCAM (Deletion/duplication testing only), GREM1 (promoter region deletion/duplication testing only), KIT, MEN1, MLH1, MSH2, MSH3, MSH6, MUTYH, NBN, NF1, NHTL1, PALB2, PDGFRA, PMS2, POLD1, POLE, PTEN, RAD50, RAD51C, RAD51D, SDHB, SDHC, SDHD, SMAD4, SMARCA4. STK11, TP53, TSC1, TSC2, and VHL.  The following genes were evaluated for sequence changes only: SDHA and HOXB13 c.251G>A variant only.  We discussed that only 5-10% of cancers are associated with a Hereditary cancer predisposition syndrome.  One of the most common hereditary cancer syndromes that increases breast cancer risk is called Hereditary Breast and Ovarian Cancer (HBOC) syndrome.  This syndrome is  caused by mutations in the BRCA1 and BRCA2 genes.  This syndrome increases an individual's lifetime risk to develop breast, ovarian, pancreatic, and other types of cancer.  She already had genetic testing in 2012 for the BRCA1 and 2 genes, therefore it is very unlikely that we would find a mutation in one of these genes with updated testing.  However, there are many other genes that are now known to increase risk for breast cancer that we recommend testing for.  Examples of these were disucssed including CHEK2, ATM, PALB2, and others.  There are also many other cancer predisposition syndromes caused by mutations in several other genes.  We discussed that if she is found to have a mutation in one of these genes, it may impact future medical management recommendations such as increased cancer screenings and consideration of risk reducing surgeries.  A positive result could also have implications for the patient's family members.  A Negative result would mean we were unable to identify a hereditary component to her cancer, but does not rule out the possibility of a hereditary basis for her  cancer.  There could be mutations that are undetectable by current technology, or in genes not yet tested or identified to increase cancer risk.    We discussed the potential to find a Variant of Uncertain Significance or VUS.  These are variants that have not yet been identified as pathogenic or benign, and it is unknown if this variant is associated with increased cancer risk or if this is a normal finding.  Most VUS's are reclassified to benign or likely benign.   It should not be used to make medical management decisions. With time, we suspect the lab will determine the significance of any VUS's identified if any.   Based on Renee Mcdaniel's personal and family history of cancer, she meets medical criteria for genetic testing. Despite that she meets criteria, she may still have an out of pocket cost. We discussed that if  her out of pocket cost for testing is over $100, the laboratory will call and confirm whether she wants to proceed with testing.  If the out of pocket cost of testing is less than $100 she will be  billed by the genetic testing laboratory.    PLAN: After considering the risks, benefits, and limitations, Renee Mcdaniel  provided informed consent to pursue genetic testing and the blood sample was sent to [facility] for analysis of the Common Hereditary Cancer Panel. Results should be available within approximately 2-3 weeks' time, at which point they will be disclosed by telephone to Renee. Kassing, as will any additional recommendations warranted by these results. Renee. Eifert will receive a summary of her genetic counseling visit and a copy of her results once available. This information will also be available in Epic. We encouraged Renee. Koloski to remain in contact with cancer genetics annually so that we can continuously update the family history and inform her of any changes in cancer genetics and testing that may be of benefit for her family. Renee. Eltringham's questions were answered to her satisfaction today. Our contact information was provided should additional questions or concerns arise.  Lastly, we encouraged Renee. Cogliano to remain in contact with cancer genetics annually so that we can continuously update the family history and inform her of any changes in cancer genetics and testing that may be of benefit for this family.   Renee.  Oatis's questions were answered to her satisfaction today. Our contact information was provided should additional questions or concerns arise. Thank you for the referral and allowing [place] to share in the care of your patient.   Tana Felts, Renee Genetic Counselor [email or web address] phone: [phone]  The patient was seen for a total of 40 minutes in face-to-face genetic counseling.

## 2017-08-28 ENCOUNTER — Telehealth: Payer: Self-pay | Admitting: Genetics

## 2017-08-28 ENCOUNTER — Ambulatory Visit: Payer: Self-pay | Admitting: Genetics

## 2017-08-28 ENCOUNTER — Encounter: Payer: Self-pay | Admitting: Genetics

## 2017-08-28 DIAGNOSIS — Z801 Family history of malignant neoplasm of trachea, bronchus and lung: Secondary | ICD-10-CM

## 2017-08-28 DIAGNOSIS — Z803 Family history of malignant neoplasm of breast: Secondary | ICD-10-CM

## 2017-08-28 DIAGNOSIS — Z807 Family history of other malignant neoplasms of lymphoid, hematopoietic and related tissues: Secondary | ICD-10-CM

## 2017-08-28 DIAGNOSIS — Z1379 Encounter for other screening for genetic and chromosomal anomalies: Secondary | ICD-10-CM

## 2017-08-28 DIAGNOSIS — Z171 Estrogen receptor negative status [ER-]: Secondary | ICD-10-CM

## 2017-08-28 DIAGNOSIS — C50411 Malignant neoplasm of upper-outer quadrant of right female breast: Secondary | ICD-10-CM

## 2017-08-28 NOTE — Progress Notes (Signed)
HPI: Ms. Cilento was previously seen in the [facility] clinic on 08/07/2017 due to a personal history of family of breast cancer and concerns regarding a hereditary predisposition to cancer. Please refer to our prior cancer genetics clinic note for more information regarding Ms. Crisci's medical, social and family histories, and our assessment and recommendations, at the time. Ms. Burpee's recent genetic test results were disclosed to her, as well as recommendations warranted by these results. These results and recommendations are discussed in more detail below.  She had previous testing for the BRCA1 and BRCA2 genes in 2012, but had not had a full panel of cancer risk genes analyzed.   CANCER HISTORY:    Breast cancer of upper-outer quadrant of right female breast ([facility])   02/07/2011 Initial Biopsy    Right breast needle core bx (10:00): invasive ductal carcinoma, 1.7 cm, grade 3, ER-, PR-, HER2/neu-, Ki67 77%; LN: benign      02/07/2011 Clinical Stage    Stage IA: T1 N0      02/08/2011 Breast MRI    Right axillary LN: 1.3 cm with thickened cortex and replacement of fatty hilum; more superior than previously sampled node; right breast mass with internal clip artifcal, measuring 0.8 cm maximally      02/27/2011 Procedure    BRCA1/2 negative      03/02/2011 Definitive Surgery    Right lumpectomy/SLNB: invasive ductal carcinoma, negative margins, grade 3, ER-, PR-, HER2/neu - , Ki67 77%, 0/4 LN      03/02/2011 Pathologic Stage    Stage IA; pT1c pN0      04/06/2011 - 06/08/2011 Chemotherapy    Adjuvant docetaxel and cyclophosphamide x 4       07/06/2011 - 08/22/2011 Radiation Therapy    Adjuvant RT to right breast        FAMILY HISTORY:  We obtained a detailed, 4-generation family history.  Significant diagnoses are listed below: Family History  Problem Relation Age of Onset  . Breast cancer Mother 85  . Thyroid disease Sister   . Hyperlipidemia Sister   .  Thyroid disease Sister   . Hyperlipidemia Sister   . Cancer Maternal Uncle 3       type unknown  . Heart disease Maternal Grandfather        died in 97's  . Cancer Paternal Grandmother        type unk, age dx unk  . Multiple myeloma Paternal Grandfather 29  . Lung cancer Cousin    Ms. Obey has a 3 year-old biological daughter and an adopted daughter.  Ms Folz has 2 full sisters and 2 full brothers with no history of cancer.  Her full sisters have children, none with any history of cancer.  Ms. Aulds also has 2 maternal half-sisters with no history of cancer.  These half-sisters have children with no history of cancer.   Ms. Mcgriff's father died in his 54's due to an accident.  He had several siblings, none with any history of cancer.  One of Ms. Navarez's paternal aunts had a son who was recently diagnosed with late stage lung cancer.  Her other paternal cousins have no history of cancer.  Ms. Mayhall's paternal grandfather died in his 67's/70's due to multiple myeloma/lymphoma.  Her paternal grandmother died due to cancer- type and age of dx is unknown.  She died before Ms. Azzarello was born so she has little information about this grandmother.    Ms. Adney's mother died at 51 and  had a history of breast cancer diagnosed at the age of 57.  Ms. Yano had a maternal uncle who died at the age of 3 due to an unknown type of cancer. She also has a maternal aunt or uncle who died at 52 days old (cause unk).  Ms. Schickling maternal grandfather died in hs 60's and had heart disease.  Her maternal grandmother died in her 83'J due to complications after a fall.    Ms. Encarnacion reports that no other relatives in her family have had any problems with several colon polyps or breast biopsies that she is aware of   Ms. Osborne is unaware of of previous family history of genetic testing for hereditary cancer risks. Patient's maternal ancestors are of [place] descent, and paternal  ancestors are of English descent. There is no reported Ashkenazi Jewish ancestry. There is no known consanguinity.  GENETIC TEST RESULTS: Genetic testing performed through [facility]'s Common Hereditary Cancer Panel reported out on 08/27/2017 showed no pathogenic mutations. The Common Hereditary Cancer Gene Panel offered by [facility] includes sequencing and/or deletion duplication testing of the following 47 genes: APC, ATM, AXIN2, BARD1, BMPR1A, BRCA1, BRCA2, BRIP1, CDH1, CDKN2A (p14ARF), CDKN2A (p16INK4a), CKD4, CHEK2, CTNNA1, DICER1, EPCAM (Deletion/duplication testing only), GREM1 (promoter region deletion/duplication testing only), KIT, MEN1, MLH1, MSH2, MSH3, MSH6, MUTYH, NBN, NF1, NHTL1, PALB2, PDGFRA, PMS2, POLD1, POLE, PTEN, RAD50, RAD51C, RAD51D, SDHB, SDHC, SDHD, SMAD4, SMARCA4. STK11, TP53, TSC1, TSC2, and VHL.  The following genes were evaluated for sequence changes only: SDHA and HOXB13 c.251G>A variant only..  The test report will be scanned into EPIC and will be located under the Molecular Pathology section of the Results Review tab.A portion of the result report is included below for reference.     We discussed with Ms. Rademaker that because current genetic testing is not perfect, it is possible there may be a gene mutation in one of these genes that current testing cannot detect, but that chance is small. We also discussed, that there could be another gene that has not yet been discovered, or that we have not yet tested, that is responsible for the cancer diagnoses in the family. It is also possible there is a hereditary cause for the cancer in the family that Ms. Burrough did not inherit and therefore was not identified in her testing.  Therefore, it is important to remain in touch with cancer genetics in the future so that we can continue to offer Ms. Scherman the most up to date genetic testing.   ADDITIONAL GENETIC TESTING: We discussed with Ms. Wix that there are other genes that  are associated with increased cancer risk that can be analyzed. The laboratories that offer this testing look at these additional genes via a hereditary cancer gene panel. Should Ms. Gramajo wish to pursue additional genetic testing, we are happy to discuss and coordinate this testing, at any time.    CANCER SCREENING RECOMMENDATIONS: Ms. Sroka test result is negative (normal). This means that we have not identified a hereditary cause for her personal and family history of cancer at this time.   While reassuring, this does not definitively rule out a hereditary basis for her cancer. It is still possible that there could be genetic mutations that are undetectable by current technology, or genetic mutations in genes that have not been tested or identified to increase cancer risk.    It is recommended she continue to follow the cancer management and screening guidelines provided by her oncology and primary healthcare  providers. Other factors such as her personal and family history may still affect her cancer risk.  RECOMMENDATIONS FOR FAMILY MEMBERS: Women in this family might be at some increased risk of developing cancer, over the general population risk, simply due to the family history of cancer. We recommended women in this family have a yearly mammogram beginning at age 25, or 91 years younger than the earliest onset of cancer, an annual clinical breast exam, and perform monthly breast self-exams. We recommend her daughter and sisters tell their doctors about the family history of cancer and begin annual mammograms by age 34.  and  Women in this family should also have a gynecological exam as recommended by their primary provider. All family members should have a colonoscopy by age 61.  All family members should inform their physicians about the family history of cancer so their doctors can make the most appropriate screening recommendations for them.   FOLLOW-UP: Lastly, we discussed with Ms.  Hoadley that cancer genetics is a rapidly advancing field and it is possible that new genetic tests will be appropriate for her and/or her family members in the future. We encouraged her to remain in contact with cancer genetics on an annual basis so we can update her personal and family histories and let her know of advances in cancer genetics that may benefit this family.   Our contact number was provided. Ms. West's questions were answered to her satisfaction, and she knows she is welcome to call us at anytime with additional questions or concerns.   Ferol Luz, MS Genetic Counselor [email or web address]

## 2017-08-28 NOTE — Telephone Encounter (Signed)
Revealed negative genetic testing.   This normal result is reassuring and indicates that it is unlikely Renee Mcdaniel's cancer is due to a hereditary cause.  It is unlikely that there is an increased risk of another cancer due to a mutation in one of these genes.  However, genetic testing is not perfect, and cannot definitively rule out a hereditary cause.  It will be important for her to keep in contact with genetics to learn if any additional testing may be needed in the future.   We recommended her daughter tell her doctors about the family history of cancer and start annual mammograms by age 63.

## 2017-09-04 ENCOUNTER — Telehealth: Payer: Self-pay | Admitting: Genetics

## 2017-09-04 NOTE — Telephone Encounter (Signed)
I called to follow up with patient that she got the information she needed.  She states that her sister is meeting with an oncologist tomorrow due to masses found in her small bowel/ovaries and wanted to have all the information possible to present to the oncologist.  Her letter had her genetic test report and the summary letter, I informed her that the only other document that I had that may be helpful for her is the family history/pedigree.  She asked for this to be sent electronically, and I informed her I could send a secure email she would have to login in to see to get the document and she agreed this is how she wanted this document.  She knows she can contact us with any further questions.

## 2017-09-24 ENCOUNTER — Telehealth: Payer: Self-pay | Admitting: Genetics

## 2017-09-24 NOTE — Telephone Encounter (Signed)
I called Renee Mcdaniel informing her that her sister's genetic counselor has requested I send a copy of her genetic test results to her to help guide her sister's genetic testing.  Renee Mcdaniel gave permission to share her genetic test results, notes, any other information that would be helpful for her sister's Dietitian.  Her sister's name is [name].  She will call if any other questions come up.

## 2018-01-17 ENCOUNTER — Encounter (INDEPENDENT_AMBULATORY_CARE_PROVIDER_SITE_OTHER): Payer: Self-pay | Admitting: Orthopaedic Surgery

## 2018-01-17 ENCOUNTER — Ambulatory Visit (INDEPENDENT_AMBULATORY_CARE_PROVIDER_SITE_OTHER): Payer: BLUE CROSS/BLUE SHIELD | Admitting: Orthopaedic Surgery

## 2018-01-17 DIAGNOSIS — M722 Plantar fascial fibromatosis: Secondary | ICD-10-CM | POA: Diagnosis not present

## 2018-01-17 DIAGNOSIS — M7661 Achilles tendinitis, right leg: Secondary | ICD-10-CM | POA: Diagnosis not present

## 2018-01-17 MED ORDER — METHYLPREDNISOLONE 4 MG PO TABS: ORAL_TABLET | ORAL | 0 refills | Status: DC

## 2018-01-17 NOTE — Progress Notes (Signed)
Office Visit Note   Patient: Renee Mcdaniel           Date of Birth: Nov 12, 1965           MRN: [id number] Visit Date: 01/17/2018              Requested by: Adin Hector, MD [place], [place] [id number] PCP: Adin Hector, MD   Assessment & Plan: Visit Diagnoses:  1. Plantar fasciitis, bilateral   2. Achilles tendinitis, right leg     Plan:  Discussed with her at length shoe wear: Also discussed gastrocs soleus stretching activities.  We will place her on Medrol Dosepak no NSAIDs while on the Dosepak.  Then she will resume her Mobic.  She will follow-up with [place] on as-needed basis pain persist or becomes worse.  Questions encouraged and at answered by Dr. Ninfa Linden and myself.  Follow-Up Instructions: Return if symptoms worsen or fail to improve.   Orders:  No orders of the defined types were placed in this encounter.  Meds ordered this encounter  Medications  . methylPREDNISolone (MEDROL) 4 MG tablet    Sig: Take as directed    Dispense:  21 tablet    Refill:  0      Procedures: No procedures performed   Clinical Data: No additional findings.   Subjective: Chief Complaint  Patient presents with  . Left Leg - Pain  . Right Leg - Pain    HPI Renee Mcdaniel is a 52 year old female comes in today due to pain in bilateral calves.  Also heel pain bilaterally.  She states that she is having no numbness or tingling down either leg.  She denies any back pain.  She notes that her pain becomes better in the winter and then becomes worse in the spring and summer she feels that this is due to shoe wear.  She does have to wear dress shoes for work.  She has had no known injury.  She does take some Mobic which helps some.  She is also been taking to [place] as of 2 years ago and noted that this past year was not as bad as far as the calf pain and heel pain.  She has pain in both heels whenever she first gets up in the morning. Review of  Systems Please see HPI otherwise negative  Objective: Vital Signs: LMP 04/19/2011   Physical Exam  Constitutional: She is oriented to person, place, and time. She appears well-developed and well-nourished. No distress.  Pulmonary/Chest: Effort normal.  Neurological: She is alert and oriented to person, place, and time.  Skin: She is not diaphoretic.  Psychiatric: She has a normal mood and affect.    Ortho Exam Bilateral calves are supple she has some tenderness in both calves.  Dorsal pedal pulses are 2+ and equal and symmetric.  Sensation grossly intact bilateral feet to light touch.  Nontender the posterior tibial tendons peroneal tendons inversion eversion 5 out of 5 strength bilaterally.  She has good dorsiflexion plantarflexion bilateral ankles.  Tenderness over the right Achilles at the insertion.  Achilles are intact bilaterally.  Tenderness over the medial tubercle of calcaneus bilaterally.  Specialty Comments:  No specialty comments available.  Imaging: No results found.   PMFS History: Patient Active Problem List   Diagnosis Date Noted  . Plantar fasciitis, bilateral 01/17/2018  . Achilles tendinitis, right leg 01/17/2018  . Genetic testing 08/28/2017  . Family history of  breast cancer   . Family history of multiple myeloma   . Family history of lung cancer   . Migraines 10/07/2015  . Left breast mass 04/01/2015  . Dyspareunia 04/01/2015  . Breast cancer of upper-outer quadrant of right female breast ([facility]) 03/24/2011   Past Medical History:  Diagnosis Date  . Breast cancer ([facility])   . Cancer ([facility])    rt. breast ca, stage I  . Family history of breast cancer   . Family history of lung cancer   . Family history of multiple myeloma   . Hyperlipidemia   . Migraines    chronic  . Personal history of radiation therapy   . Prediabetes   . Thyroid disease   . Vertigo     Family History  Problem Relation Age of Onset  . Breast cancer Mother 18  . Thyroid  disease Sister   . Hyperlipidemia Sister   . Thyroid disease Sister   . Hyperlipidemia Sister   . Cancer Maternal Uncle 3       type unknown  . Heart disease Maternal Grandfather        died in 25's  . Cancer Paternal Grandmother        type unk, age dx unk  . Multiple myeloma Paternal Grandfather 40  . Lung cancer Cousin     Past Surgical History:  Procedure Laterality Date  . BREAST LUMPECTOMY Right    2012  . BREAST SURGERY     s/p lumpectomy, sentinel node biopsy, port  . CESAREAN SECTION    . NASAL SEPTUM SURGERY    . PORT-A-CATH REMOVAL    . REDUCTION MAMMAPLASTY     left  . TONSILLECTOMY     Social History   Occupational History  . Not on file  Tobacco Use  . Smoking status: Never Smoker  . Smokeless tobacco: Never Used  Substance and Sexual Activity  . Alcohol use: No    Comment: Rarely  . Drug use: No  . Sexual activity: Yes    Birth control/protection: None

## 2018-03-04 ENCOUNTER — Ambulatory Visit (INDEPENDENT_AMBULATORY_CARE_PROVIDER_SITE_OTHER): Payer: BLUE CROSS/BLUE SHIELD

## 2018-03-04 ENCOUNTER — Encounter (INDEPENDENT_AMBULATORY_CARE_PROVIDER_SITE_OTHER): Payer: Self-pay | Admitting: Orthopaedic Surgery

## 2018-03-04 ENCOUNTER — Ambulatory Visit (INDEPENDENT_AMBULATORY_CARE_PROVIDER_SITE_OTHER): Payer: BLUE CROSS/BLUE SHIELD | Admitting: Orthopaedic Surgery

## 2018-03-04 DIAGNOSIS — M25522 Pain in left elbow: Secondary | ICD-10-CM

## 2018-03-04 MED ORDER — DICLOFENAC SODIUM 1 % TD GEL: 2.0000 g | Freq: Four times a day (QID) | TRANSDERMAL | 3 refills | Status: DC

## 2018-03-04 MED ORDER — METHYLPREDNISOLONE 4 MG PO TABS: ORAL_TABLET | ORAL | 0 refills | Status: DC

## 2018-03-04 NOTE — Progress Notes (Signed)
Office Visit Note   Patient: Renee Mcdaniel           Date of Birth: [birth year]           MRN: [id number] Visit Date: 03/04/2018              Requested by: Adin Hector, MD [place], [place] [id number] PCP: Adin Hector, MD   Assessment & Plan: Visit Diagnoses:  1. Pain in left elbow     Plan: Hopefully this is a strain of the muscles and tendons in this area that we will continue to improve with time.  We will see her back in a month because this is not better than we can always try a steroid injection.  We are going put her on Voltaren gel for insurance will cover it as well as extensive steroid taper.  All questions concerns were answered and addressed.  She will continue her meloxicam as well.  Follow-Up Instructions: Return in about 1 month (around 04/01/2018).   Orders:  Orders Placed This Encounter  Procedures  . XR Elbow Complete Left (3+View)   Meds ordered this encounter  Medications  . methylPREDNISolone (MEDROL) 4 MG tablet    Sig: Medrol dose pack. Take as instructed    Dispense:  21 tablet    Refill:  0  . diclofenac sodium (VOLTAREN) 1 % GEL    Sig: Apply 2 g topically 4 (four) times daily.    Dispense:  100 g    Refill:  3      Procedures: No procedures performed   Clinical Data: No additional findings.   Subjective: Chief Complaint  Patient presents with  . Left Elbow - Pain  Patient comes in today with 19-monthhistory of left elbow pain.  She was out in garden working digging around the ground was too hard and she got a little sharp pain in her lateral elbow and the triceps area down to the lateral epicondyle area.  Really flared up quite a bit and is only recently started feeling a little bit better.  She denies any motion difficulties with the arm and denies any numbness and tingling definitely hurts of her wide area.  She denies any elbow swelling.  She has not injured this area  before.  HPI  Review of Systems She currently denies any headache, chest pain, shortness of breath, fever, chills, nausea, vomiting.  Objective: Vital Signs: LMP 04/19/2011   Physical Exam She is alert and oriented x3 and in no acute distress Ortho Exam Examination of her left elbow shows pain over the lateral epicondyle area in the lateral triceps.  There is no decreased function of the elbow in terms of decreased motion.  Her motion is full and she is strong.  She is neurovascular intact.  There is no elbow effusion. Specialty Comments:  No specialty comments available.  Imaging: Xr Elbow Complete Left (3+view)  Result Date: 03/04/2018 3 views of the left elbow show no acute findings.    PMFS History: Patient Active Problem List   Diagnosis Date Noted  . Plantar fasciitis, bilateral 01/17/2018  . Achilles tendinitis, right leg 01/17/2018  . Genetic testing 08/28/2017  . Family history of breast cancer   . Family history of multiple myeloma   . Family history of lung cancer   . Migraines 10/07/2015  . Left breast mass 04/01/2015  . Dyspareunia 04/01/2015  . Breast cancer of upper-outer quadrant of  right female breast ([facility]) 03/24/2011   Past Medical History:  Diagnosis Date  . Breast cancer ([facility])   . Cancer ([facility])    rt. breast ca, stage I  . Family history of breast cancer   . Family history of lung cancer   . Family history of multiple myeloma   . Hyperlipidemia   . Migraines    chronic  . Personal history of radiation therapy   . Prediabetes   . Thyroid disease   . Vertigo     Family History  Problem Relation Age of Onset  . Breast cancer Mother 31  . Thyroid disease Sister   . Hyperlipidemia Sister   . Thyroid disease Sister   . Hyperlipidemia Sister   . Cancer Maternal Uncle 3       type unknown  . Heart disease Maternal Grandfather        died in 14's  . Cancer Paternal Grandmother        type unk, age dx unk  . Multiple myeloma Paternal  Grandfather 35  . Lung cancer Cousin     Past Surgical History:  Procedure Laterality Date  . BREAST LUMPECTOMY Right    2012  . BREAST SURGERY     s/p lumpectomy, sentinel node biopsy, port  . CESAREAN SECTION    . NASAL SEPTUM SURGERY    . PORT-A-CATH REMOVAL    . REDUCTION MAMMAPLASTY     left  . TONSILLECTOMY     Social History   Occupational History  . Not on file  Tobacco Use  . Smoking status: Never Smoker  . Smokeless tobacco: Never Used  Substance and Sexual Activity  . Alcohol use: No    Comment: Rarely  . Drug use: No  . Sexual activity: Yes    Birth control/protection: None

## 2018-04-03 ENCOUNTER — Ambulatory Visit (INDEPENDENT_AMBULATORY_CARE_PROVIDER_SITE_OTHER): Payer: BLUE CROSS/BLUE SHIELD | Admitting: Orthopaedic Surgery

## 2018-06-25 ENCOUNTER — Other Ambulatory Visit: Payer: Self-pay | Admitting: Obstetrics and Gynecology

## 2018-06-25 DIAGNOSIS — Z853 Personal history of malignant neoplasm of breast: Secondary | ICD-10-CM

## 2018-08-09 ENCOUNTER — Ambulatory Visit
Admission: RE | Admit: 2018-08-09 | Discharge: 2018-08-09 | Disposition: A | Discharge status: Home / Self Care | Payer: BLUE CROSS/BLUE SHIELD | Source: Ambulatory Visit | Attending: Obstetrics and Gynecology | Admitting: Obstetrics and Gynecology

## 2018-08-09 DIAGNOSIS — Z853 Personal history of malignant neoplasm of breast: Secondary | ICD-10-CM

## 2018-10-01 ENCOUNTER — Telehealth: Payer: Self-pay | Admitting: *Deleted

## 2018-10-01 NOTE — Telephone Encounter (Signed)
Medical Records faxed to [facility]; RID [id number]

## 2019-06-16 LAB — BASIC METABOLIC PANEL
BUN: 12 (ref 4–21)
Creatinine: 1 (ref 0.5–1.1)
Glucose: 85
Potassium: 4 (ref 3.4–5.3)
Sodium: 145 (ref 137–147)

## 2019-06-16 LAB — HEPATIC FUNCTION PANEL
ALT: 21 (ref 7–35)
AST: 18 (ref 13–35)
Alkaline Phosphatase: 73 (ref 25–125)
Bilirubin, Total: 0.4

## 2019-06-16 LAB — LIPID PANEL
Cholesterol: 188 (ref 0–200)
HDL: 64 (ref 35–70)
LDL Cholesterol: 113
LDl/HDL Ratio: 1.8
Triglycerides: 60 (ref 40–160)

## 2019-06-16 LAB — TSH: TSH: 3.07 (ref 0.41–5.90)

## 2019-06-17 LAB — HM PAP SMEAR: HM Pap smear: NEGATIVE

## 2019-07-09 ENCOUNTER — Other Ambulatory Visit: Payer: Self-pay | Admitting: Obstetrics and Gynecology

## 2019-07-31 ENCOUNTER — Ambulatory Visit (INDEPENDENT_AMBULATORY_CARE_PROVIDER_SITE_OTHER): Payer: 59 | Admitting: Family Medicine

## 2019-07-31 ENCOUNTER — Encounter: Payer: Self-pay | Admitting: Family Medicine

## 2019-07-31 ENCOUNTER — Other Ambulatory Visit: Payer: Self-pay

## 2019-07-31 VITALS — BP 114/72 | HR 85 | Temp 98.6°F | Ht 61.75 in | Wt 187.2 lb

## 2019-07-31 DIAGNOSIS — M25552 Pain in left hip: Secondary | ICD-10-CM

## 2019-07-31 DIAGNOSIS — Z23 Encounter for immunization: Secondary | ICD-10-CM

## 2019-07-31 NOTE — Assessment & Plan Note (Signed)
Etiology not entirely clear. Complaining of low back and lateral hip pain. Does not seem to have hip joint pathology or bursitis. Wonder about piriformis or glute issue. Do suspect most likely muscle related. Advised PT now and offered sports medicine referral to get further clarity on the diagnosis which patient preferred. Discussed that without a clear diagnosis I did not feel she had signs that would warrant systemic steroids.

## 2019-07-31 NOTE — Progress Notes (Signed)
Subjective:     Renee Mcdaniel is a 53 y.o. female presenting for Establish Care (previous PCP Eustace Moore Kline-last visit maybe 16 years ago. [facility].) and Hip Pain (sciatic nerve pain left hip/buttocks area radiating down the left leg. x 4 months. Back pain x 6 months)     HPI  #Hip Pain - x 6 months - has been going on since the spring - hx of it coming on and off - Treatment: chiropractor, NSAIDs w/o improvement - will radiate down the leg - wonders if a steroid would be helpful - in the past a steroid has helped with carpal tunnel treatment - no hx of doing PT in the past - feels it in the hip currently - radiating symptoms are rare - pain is more in the side of the hip  #Chronic HA - taking valium and ambien - prescribed by neurology - on BP medication for occasional weather related migraines - also does NSAIDs   OB/GYN has been prescribing the welbutrin and the lipitor - feels like her depression is well controlled - had labs done recently  Review of Systems  Genitourinary:       No loss of control or bowel or bladder  Neurological: Negative for numbness.       No tingling     Social History   Tobacco Use  Smoking Status Never Smoker  Smokeless Tobacco Never Used        Objective:    BP Readings from Last 3 Encounters:  07/31/19 114/72  08/02/16 102/71  02/23/16 128/86   Wt Readings from Last 3 Encounters:  07/31/19 187 lb 4 oz (84.9 kg)  08/02/16 199 lb 3.2 oz (90.4 kg)  02/23/16 206 lb 9.6 oz (93.7 kg)    BP 114/72   Pulse 85   Temp 98.6 F (37 C)   Ht 5' 1.75" (1.568 m)   Wt 187 lb 4 oz (84.9 kg)   LMP 04/19/2011   SpO2 97%   BMI 34.53 kg/m    Physical Exam Constitutional:      General: She is not in acute distress.    Appearance: She is well-developed. She is not diaphoretic.  HENT:     Right Ear: External ear normal.     Left Ear: External ear normal.     Nose: Nose normal.  Eyes:     Conjunctiva/sclera:  Conjunctivae normal.  Neck:     Musculoskeletal: Neck supple.  Cardiovascular:     Rate and Rhythm: Normal rate.  Pulmonary:     Effort: Pulmonary effort is normal.  Musculoskeletal:     Comments: Back:  Inspection: no swelling or deformity Palpation: no spinous or paraspinous TTP ROM: normal w/o pain Negative straight leg raise  Left Hip:  Inspection: no swelling Palpation: limited by body habitus, but maximal TTP along the gluteal region, no TTP along the Greater Trochanter ROM: normal, no pain Strength: Decreased abduction, otherwise normal  Skin:    General: Skin is warm and dry.     Capillary Refill: Capillary refill takes less than 2 seconds.  Neurological:     Mental Status: She is alert. Mental status is at baseline.  Psychiatric:        Mood and Affect: Mood normal.        Behavior: Behavior normal.           Assessment & Plan:   Problem List Items Addressed This Visit      Other  Left hip pain - Primary    Etiology not entirely clear. Complaining of low back and lateral hip pain. Does not seem to have hip joint pathology or bursitis. Wonder about piriformis or glute issue. Do suspect most likely muscle related. Advised PT now and offered sports medicine referral to get further clarity on the diagnosis which patient preferred. Discussed that without a clear diagnosis I did not feel she had signs that would warrant systemic steroids.       Relevant Orders   Ambulatory referral to Physical Therapy       Return for schedule with Dr. Lorelei Pont for Hip Pain evaluation.  Lesleigh Noe, MD

## 2019-07-31 NOTE — Patient Instructions (Signed)
#   hip and back pain - come back to see Dr. Lorelei Pont - Go to physical therapy

## 2019-08-04 ENCOUNTER — Other Ambulatory Visit: Payer: Self-pay | Admitting: Obstetrics and Gynecology

## 2019-08-04 DIAGNOSIS — N644 Mastodynia: Secondary | ICD-10-CM

## 2019-08-06 ENCOUNTER — Encounter: Payer: Self-pay | Admitting: Family Medicine

## 2019-08-06 ENCOUNTER — Ambulatory Visit (INDEPENDENT_AMBULATORY_CARE_PROVIDER_SITE_OTHER)
Admission: RE | Admit: 2019-08-06 | Discharge: 2019-08-06 | Disposition: A | Discharge status: Home / Self Care | Payer: 59 | Source: Ambulatory Visit | Attending: Family Medicine | Admitting: Family Medicine

## 2019-08-06 ENCOUNTER — Other Ambulatory Visit: Payer: Self-pay

## 2019-08-06 ENCOUNTER — Ambulatory Visit (INDEPENDENT_AMBULATORY_CARE_PROVIDER_SITE_OTHER): Payer: 59 | Admitting: Family Medicine

## 2019-08-06 VITALS — BP 110/70 | HR 94 | Temp 98.0°F | Ht 61.75 in | Wt 186.0 lb

## 2019-08-06 DIAGNOSIS — G5702 Lesion of sciatic nerve, left lower limb: Secondary | ICD-10-CM

## 2019-08-06 DIAGNOSIS — M5416 Radiculopathy, lumbar region: Secondary | ICD-10-CM

## 2019-08-06 NOTE — Patient Instructions (Signed)
PIRIFORMIS SYNDROME REHAB .  4. SINK STRETCH - YOU CAN DO THIS WHENEVER YOU WANT DURING THE DAY  Tennis ball underneath area in buttocks - on a hard surface underneath Can also massage this area with an Counsellor or hand

## 2019-08-06 NOTE — Progress Notes (Signed)
Renee Boddy T. Elaura Calix, MD Primary Care and Sports Medicine [facility] at [facility] [name], [address] Phone: [phone]  FAX: [place] - 53 y.o. female  MRN [id number]  Date of Birth: [birth year]  Visit Date: 08/06/2019  PCP: Lesleigh Noe, MD  Referred by: Adin Hector, MD  Chief Complaint  Patient presents with  . Hip Pain    Left ?Sciatica   Subjective:   Renee Mcdaniel is a 53 y.o. very pleasant female patient with Body mass index is 34.3 kg/m. who presents with the following:  Renee Mcdaniel is here today having previously established with Dr. Einar Pheasant, who referred the patient here today to see me with a question of possible hip pain versus sciatica on the left side.  Typically she does not have back pain or radicular pain, but she has been having pain in the posterior buttocks since the spring.  She occasionally does have some pain that radiates down the posterior upper leg and occasionally this will go all the way down to the ankle.  This is been ongoing for about 5 months.  She is tried multiple things over-the-counter and including meloxicam without much significant relief of symptoms.  She notes that it is worse when seated for quite a long time.  Her pain is at its worst in the posterior of the buttocks in the midportion of the posterior pelvis.  Her activity includes going up and down stairs at work and doing some gardening and walking during the summer  No major trauma or surgery.  chiro has helped in the past.  Saw amanda write, dc.  Trouble to get in, and reasonable as this has not really helped at all. Mobic  Pi synd l  Past Medical History, Surgical History, Social History, Family History, Problem List, Medications, and Allergies have been reviewed and updated if relevant.  Patient Active Problem List   Diagnosis Date Noted  . Left hip pain 07/31/2019  . Plantar fasciitis, bilateral 01/17/2018  .  Achilles tendinitis, right leg 01/17/2018  . Genetic testing 08/28/2017  . Family history of breast cancer   . Family history of multiple myeloma   . Family history of lung cancer   . Migraines 10/07/2015  . Left breast mass 04/01/2015  . Dyspareunia 04/01/2015  . Breast cancer of upper-outer quadrant of right female breast ([name]) 03/24/2011    Past Medical History:  Diagnosis Date  . Breast cancer ([place])   . Cancer ([place])    rt. breast ca, stage I  . Family history of breast cancer   . Family history of lung cancer   . Family history of multiple myeloma   . Hyperlipidemia   . Migraines    chronic  . Personal history of radiation therapy   . Prediabetes   . Thyroid disease   . Vertigo     Past Surgical History:  Procedure Laterality Date  . BREAST LUMPECTOMY Right    2012  . BREAST SURGERY     s/p lumpectomy, sentinel node biopsy, port  . CESAREAN SECTION    . NASAL SEPTUM SURGERY    . PORT-A-CATH REMOVAL    . REDUCTION MAMMAPLASTY     left  . TONSILLECTOMY      Social History   Socioeconomic History  . Marital status: Married    Spouse name: [name]  . Number of children: 2  . Years of education: community college  .  Highest education level: Not on file  Occupational History  . Occupation: events coordinator  Social Needs  . Financial resource strain: Not hard at all  . Food insecurity    Worry: Not on file    Inability: Not on file  . Transportation needs    Medical: Not on file    Non-medical: Not on file  Tobacco Use  . Smoking status: Never Smoker  . Smokeless tobacco: Never Used  Substance and Sexual Activity  . Alcohol use: No    Comment: Rarely-maybe 2 times a year  . Drug use: No  . Sexual activity: Yes    Birth control/protection: Post-menopausal  Lifestyle  . Physical activity    Days per week: Not on file    Minutes per session: Not on file  . Stress: Not on file  Relationships  . Social Herbalist on phone: Not on file     Gets together: Not on file    Attends religious service: Not on file    Active member of club or organization: Not on file    Attends meetings of clubs or organizations: Not on file    Relationship status: Not on file  . Intimate partner violence    Fear of current or ex partner: Not on file    Emotionally abused: Not on file    Physically abused: Not on file    Forced sexual activity: Not on file  Other Topics Concern  . Not on file  Social History Narrative   07/31/19   From: the area   Living: with husband [name] and daughter [name]   Work: [employer], events coordinator      Family: Daughter - [name] (one granddaughter, lives in [place]) and [name] (39 yo daughter)      Enjoys: gardening, reading      Exercise: job is pretty demanding - going up and down the stairs   Diet: eating 1-2 times, intermittent fasting to lose weight      Safety   Seat belts: Yes    Guns: Yes  and secure   Safe in relationships: Yes     Family History  Problem Relation Age of Onset  . Breast cancer Mother 40  . Dementia Mother   . Thyroid disease Sister   . Hyperlipidemia Sister   . Thyroid disease Sister   . Hyperlipidemia Sister   . Endometrial cancer Sister        stage 3  . Cancer Maternal Uncle 3       type unknown  . Heart disease Maternal Grandfather        died in 105's  . Cancer Paternal Grandmother        type unk, age dx unk  . Multiple myeloma Paternal Grandfather 25  . Lung cancer Cousin     Allergies  Allergen Reactions  . Amoxicillin-Pot Clavulanate Nausea And Vomiting  . Oxycodone Nausea Only    Medication list reviewed and updated in full in [place].  GEN: No fevers, chills. Nontoxic. Primarily MSK c/o today. MSK: Detailed in the HPI GI: tolerating PO intake without difficulty Neuro: No numbness, parasthesias, or tingling associated. Otherwise the pertinent positives of the ROS are noted above.   Objective:   BP 110/70   Pulse 94   Temp 98 F (36.7  C) (Temporal)   Ht 5' 1.75" (1.568 m)   Wt 186 lb (84.4 kg)   LMP 04/19/2011   SpO2 97%   BMI  34.30 kg/m    GEN: Well-developed,well-nourished,in no acute distress; alert,appropriate and cooperative throughout examination HEENT: Normocephalic and atraumatic without obvious abnormalities. Ears, externally no deformities PULM: Breathing comfortably in no respiratory distress EXT: No clubbing, cyanosis, or edema PSYCH: Normally interactive. Cooperative during the interview. Pleasant. Friendly and conversant. Not anxious or depressed appearing. Normal, full affect.  Range of motion at  the waist: Flexion, extension, lateral bending and rotation: She has full and excellent range of motion at the back in all directions.  No echymosis or edema Rises to examination table with mild difficulty Gait: minimally antalgic  Inspection/Deformity: N Paraspinus Tenderness: Minimally palpable tenderness  B Ankle Dorsiflexion (L5,4): 5/5 B Great Toe Dorsiflexion (L5,4): 5/5 Heel Walk (L5): WNL Toe Walk (S1): WNL Rise/Squat (L4): WNL, mild pain  SENSORY B Medial Foot (L4): WNL B Dorsum (L5): WNL B Lateral (S1): WNL Light Touch: WNL Pinprick: WNL  REFLEXES Knee (L4): 2+ Ankle (S1): 2+  B SLR, seated: neg B SLR, supine: neg B FABER: neg B Reverse FABER:  POS B Greater Troch: NT B Log Roll: neg B Sciatic Notch: NT   HIP EXAM: SIDE: L ROM: Abduction, Flexion, Internal and External range of motion: Full range of motion in all directions Pain with terminal IROM and EROM: Minimal GTB: NT SLR: NEG Knees: No effusion FABER: TTP REVERSE FABER: NT, neg Piriformis: At direct palpation with the patient prone, there is notable tenderness and tightness at the left piriformis. Str: flexion: 5/5 abduction: 5/5 adduction: 5/5 Strength testing non-tender  Radiology: No results found.  Assessment and Plan:     ICD-10-CM   1. Piriformis syndrome of left side  G57.02   2. Left lumbar  radiculopathy  M54.16 XR Lumbar Spine   >25 minutes spent in face to face time with patient, >50% spent in counselling or coordination of care   This is pretty classic piriformis syndrome on the left side.  At this point I do not think she needs any medications over and above some NSAIDs as needed and some Tylenol.  I reviewed some classic piriformis rehab with the patient including some pigeon poses and some other classical rehab in the piriformis rehab handout.  Cannot exclude foraminal encroachment of the left L4-5 or L5-S1 nerve root, and I am going to get a plain film of the lumbar spine.  Today her pain is about 3 out of 10, if this worsens significantly or any kind of radicular symptoms worsen, it would be reasonable to do a short course of some steroids.  I appreciate the opportunity to evaluate this very friendly patient. If you have any question regarding her care or prognosis, do not hesitate to ask.   Patient Instructions  PIRIFORMIS SYNDROME REHAB .  4. SINK STRETCH - YOU CAN DO THIS WHENEVER YOU WANT DURING THE DAY  Tennis ball underneath area in buttocks - on a hard surface underneath Can also massage this area with an electronic massager or hand     Follow-up: No follow-ups on file.  No orders of the defined types were placed in this encounter.  Orders Placed This Encounter  Procedures  . XR Lumbar Spine    Signed,  Frederico Hamman T. Marquite Attwood, MD   Outpatient Encounter Medications as of 08/06/2019  Medication Sig  . alclomethasone (ACLOVATE) 0.05 % cream Apply 1 application topically daily as needed.   Marland Kitchen atorvastatin (LIPITOR) 10 MG tablet Take 10 mg by mouth daily.  . Botulinum Toxin Type A (BOTOX) 200 units SOLR As  needed for migraines  . buPROPion (WELLBUTRIN XL) 300 MG 24 hr tablet Take 1 tablet by mouth daily.  . candesartan-hydrochlorothiazide (ATACAND HCT) 16-12.5 MG tablet Take 0.5 tablets by mouth daily.  . diazepam (VALIUM) 2 MG tablet Take 2 mg by mouth  every 6 (six) hours as needed.  . Diclofenac Potassium (CAMBIA) 50 MG PACK Take 1 each by mouth as needed.  . diclofenac sodium (VOLTAREN) 1 % GEL Apply 2 g topically 4 (four) times daily.  Marland Kitchen levothyroxine (SYNTHROID, LEVOTHROID) 75 MCG tablet Take 75 mcg by mouth daily before breakfast.  . lidocaine (XYLOCAINE) 4 % external solution Apply topically as needed. Apply Lidocaine liquid 3 minutes before vaginal penetration.  . meclizine (ANTIVERT) 32 MG tablet Take 32 mg by mouth daily as needed.    . Multiple Vitamins-Minerals (WOMENS MULTI VITAMIN & MINERAL PO) Take 1 tablet by mouth daily.  . Olopatadine HCl 0.2 % SOLN olopatadine 0.2 % eye drops  INSTILL 1 DROP INTO BOTH EYES DAILY  . ondansetron (ZOFRAN) 4 MG tablet Take 4 mg by mouth as needed. Reported on 02/23/2016  . zolpidem (AMBIEN) 10 MG tablet Take 1 tablet by mouth at bedtime as needed.   No facility-administered encounter medications on file as of 08/06/2019.

## 2019-08-11 ENCOUNTER — Ambulatory Visit
Admission: RE | Admit: 2019-08-11 | Discharge: 2019-08-11 | Disposition: A | Discharge status: Home / Self Care | Payer: 59 | Source: Ambulatory Visit | Attending: Obstetrics and Gynecology | Admitting: Obstetrics and Gynecology

## 2019-08-11 ENCOUNTER — Other Ambulatory Visit: Payer: Self-pay

## 2019-08-11 DIAGNOSIS — N644 Mastodynia: Secondary | ICD-10-CM

## 2019-09-29 ENCOUNTER — Telehealth: Payer: Self-pay

## 2019-09-29 NOTE — Telephone Encounter (Signed)
Patient advised.

## 2019-09-29 NOTE — Telephone Encounter (Signed)
I generally like to see patients first and then refer to Dr. Lorelei Pont if needed.

## 2019-09-29 NOTE — Telephone Encounter (Signed)
Called pt to see if still needed PT.  Pt requests PT be canx'd.  She saw Dr. Lorelei Pont and will do home exercises and steroid injections.  Pt's 53 yr old daughter has been having chiropractic adjustments for her shoulder x 1 yrs.  For the past month, she has been having issues w/her back and adustments.  No xray was done@chiropractor .  Pt is not sure if it is a problem w/her daughter sleeping incorrectly or other.  Should have her daughter see you first or Dr. Lorelei Pont

## 2019-10-20 ENCOUNTER — Ambulatory Visit: Payer: 59 | Attending: Internal Medicine

## 2019-10-20 ENCOUNTER — Ambulatory Visit (INDEPENDENT_AMBULATORY_CARE_PROVIDER_SITE_OTHER): Payer: 59 | Admitting: Family Medicine

## 2019-10-20 ENCOUNTER — Encounter: Payer: Self-pay | Admitting: Family Medicine

## 2019-10-20 VITALS — Temp 97.7°F | Wt 189.0 lb

## 2019-10-20 DIAGNOSIS — J0140 Acute pansinusitis, unspecified: Secondary | ICD-10-CM | POA: Diagnosis not present

## 2019-10-20 DIAGNOSIS — Z20822 Contact with and (suspected) exposure to covid-19: Secondary | ICD-10-CM

## 2019-10-20 MED ORDER — DOXYCYCLINE HYCLATE 100 MG PO TABS: 100.0000 mg | ORAL_TABLET | Freq: Two times a day (BID) | ORAL | 0 refills | Status: AC

## 2019-10-20 NOTE — Progress Notes (Signed)
I connected with Renee Mcdaniel on 10/20/19 at 11:20 AM EST by video and verified that I am speaking with the correct person using two identifiers.   I discussed the limitations, risks, security and privacy concerns of performing an evaluation and management service by video and the availability of in person appointments. I also discussed with the patient that there may be a patient responsible charge related to this service. The patient expressed understanding and agreed to proceed.  Patient location: Home Provider Location: [place] Participants: Lesleigh Noe and Renee Mcdaniel   Subjective:     Renee Mcdaniel is a 54 y.o. female presenting for Sinus Problem (sinus drainage x 1 month. Drainage getting thicker and yellow. Used Netti pot, H/A.)     Sinus Problem This is a new problem. The current episode started 1 to 4 weeks ago. The problem has been gradually worsening since onset. There has been no fever. Associated symptoms include congestion, coughing, headaches, sinus pressure and a sore throat. Pertinent negatives include no chills or shortness of breath. Past treatments include spray decongestants (saline rinse). The treatment provided mild relief.   Started getting worse over the last few days Sick contact: none Loss of taste/smell: no  Review of Systems  Constitutional: Negative for chills and fever.  HENT: Positive for congestion, postnasal drip, sinus pressure and sore throat.   Respiratory: Positive for cough. Negative for shortness of breath.   Gastrointestinal: Positive for nausea. Negative for diarrhea and vomiting.       Burning  Musculoskeletal: Negative for arthralgias and myalgias.  Neurological: Positive for headaches.     Social History   Tobacco Use  Smoking Status Never Smoker  Smokeless Tobacco Never Used        Objective:   BP Readings from Last 3 Encounters:  08/06/19 110/70  07/31/19 114/72  08/02/16 102/71   Wt  Readings from Last 3 Encounters:  10/20/19 189 lb (85.7 kg)  08/06/19 186 lb (84.4 kg)  07/31/19 187 lb 4 oz (84.9 kg)   Temp 97.7 F (36.5 C) Comment: per patient  Wt 189 lb (85.7 kg) Comment: per patient  LMP 04/19/2011   BMI 34.85 kg/m    Physical Exam Constitutional:      Appearance: Normal appearance. She is not ill-appearing.  HENT:     Head: Normocephalic and atraumatic.     Right Ear: External ear normal.     Left Ear: External ear normal.  Eyes:     Conjunctiva/sclera: Conjunctivae normal.  Pulmonary:     Effort: Pulmonary effort is normal. No respiratory distress.  Neurological:     Mental Status: She is alert. Mental status is at baseline.  Psychiatric:        Mood and Affect: Mood normal.        Behavior: Behavior normal.        Thought Content: Thought content normal.        Judgment: Judgment normal.             Assessment & Plan:   Problem List Items Addressed This Visit    None    Visit Diagnoses    Acute non-recurrent pansinusitis    -  Primary   Relevant Medications   promethazine (PHENERGAN) 25 MG tablet   doxycycline (VIBRA-TABS) 100 MG tablet     Discussed that patient may also want to get tested for covid, though given duration of symptoms this seems less likely  Information for  covid testing provided and pt will pursue Remain in isolation until results ER precautions Abx for likely sinus infection   Return if symptoms worsen or fail to improve.  Lesleigh Noe, MD

## 2019-10-20 NOTE — Patient Instructions (Signed)
Covid Testing Location options:   [place] options:  Text "COVID" to [phone] - or -  [email or web address] to schedule at one of the  locations  [place] to find locations near you: [phone]  Sinus infection - Take the antibiotic  - continue the saline rinse and flonase

## 2019-10-21 LAB — NOVEL CORONAVIRUS, NAA: SARS-CoV-2, NAA: NOT DETECTED

## 2020-04-29 ENCOUNTER — Other Ambulatory Visit: Payer: Self-pay | Admitting: Obstetrics and Gynecology

## 2020-04-29 DIAGNOSIS — Z1231 Encounter for screening mammogram for malignant neoplasm of breast: Secondary | ICD-10-CM

## 2020-08-16 ENCOUNTER — Ambulatory Visit
Admission: RE | Admit: 2020-08-16 | Discharge: 2020-08-16 | Disposition: A | Discharge status: Home / Self Care | Payer: 59 | Source: Ambulatory Visit | Attending: Obstetrics and Gynecology | Admitting: Obstetrics and Gynecology

## 2020-08-16 ENCOUNTER — Other Ambulatory Visit: Payer: Self-pay

## 2020-08-16 DIAGNOSIS — Z1231 Encounter for screening mammogram for malignant neoplasm of breast: Secondary | ICD-10-CM

## 2020-09-06 LAB — COMPREHENSIVE METABOLIC PANEL
Albumin: 4.4 (ref 3.5–5.0)
Calcium: 9.6 (ref 8.7–10.7)
GFR calc Af Amer: 81
GFR calc non Af Amer: 70
Globulin: 1.7

## 2020-09-06 LAB — BASIC METABOLIC PANEL
BUN: 11 (ref 4–21)
CO2: 30 — AB (ref 13–22)
Chloride: 101 (ref 99–108)
Creatinine: 0.9 (ref 0.5–1.1)
Glucose: 76
Potassium: 3.9 (ref 3.4–5.3)
Sodium: 143 (ref 137–147)

## 2020-09-06 LAB — HEPATIC FUNCTION PANEL
ALT: 32 (ref 7–35)
AST: 20 (ref 13–35)
Alkaline Phosphatase: 72 (ref 25–125)

## 2020-09-06 LAB — VITAMIN D 25 HYDROXY (VIT D DEFICIENCY, FRACTURES): Vit D, 25-Hydroxy: 63.6

## 2020-09-06 LAB — LIPID PANEL
Cholesterol: 210 — AB (ref 0–200)
HDL: 68 (ref 35–70)
LDL Cholesterol: 129
LDl/HDL Ratio: 1.9
Triglycerides: 75 (ref 40–160)

## 2020-09-06 LAB — TSH: TSH: 5.61 (ref 0.41–5.90)

## 2020-09-06 LAB — HEMOGLOBIN A1C: Hemoglobin A1C: 5.9

## 2020-10-08 ENCOUNTER — Ambulatory Visit: Payer: 59

## 2020-11-12 ENCOUNTER — Telehealth: Payer: Self-pay

## 2020-11-12 NOTE — Telephone Encounter (Signed)
Renee Mcdaniel - Client TELEPHONE ADVICE RECORD [facility] Patient Name: Renee Mcdaniel Gender: Female DOB: 07/10/1966 Age: 55 Y 41 M 18 D Return Phone Number: [phone] (Primary) Address: [place]/State/Zip: [place] [address] Client Renee Mcdaniel Mcdaniel - Client Client Site [place] - [place] Physician Waunita Schooner- MD Contact Type Call Who Is Calling Patient / Member / Family / Caregiver Call Type Triage / Clinical Relationship To Patient Self Return Phone Number [phone] (Primary) Chief Complaint Dizziness Reason for Call Symptomatic / Request for [name] states she has a BP of 116/72 and she has extreme dizziness and has to catch herself in order to keep from passing out. Translation No Nurse Assessment Nurse: Windle Guard, RN, Olin Hauser Date/Time (Eastern Time): 11/12/2020 1:02:39 PM Confirm and document reason for call. If symptomatic, describe symptoms. ---Caller states BP is 116/72 and she has severe dizziness. Feels as if she might pass out, shaky, weak. Does the patient have any new or worsening symptoms? ---Yes Will a triage be completed? ---Yes Related visit to physician within the last 2 weeks? ---No Does the PT have any chronic conditions? (i.e. diabetes, asthma, this includes High risk factors for pregnancy, etc.) ---Yes List chronic conditions. ---Migraines, Hypothyroidism, H/O Breast Ca 10 years ago Is the patient pregnant or possibly pregnant? (Ask all females between the ages of 42-55) ---No Is this a behavioral health or substance abuse call? ---No Guidelines Guideline Title Affirmed Question Affirmed Notes Nurse Date/Time (Eastern Time) Dizziness - Lightheadedness [1] MODERATE dizziness (e.g., interferes with normal activities) AND [2] has NOT been evaluated by physician for this (Exception: dizziness caused by heat exposure, sudden standing, or poor fluid  intake) Conner, RN, Olin Hauser 11/12/2020 1:06:34 PM Disp. Time Eilene Ghazi Time) Disposition Final User PLEASE NOTE: All timestamps contained within this report are represented as [place] Standard Time. CONFIDENTIALTY NOTICE: This fax transmission is intended only for the addressee. It contains information that is legally privileged, confidential or otherwise protected from use or disclosure. If you are not the intended recipient, you are strictly prohibited from reviewing, disclosing, copying using or disseminating any of this information or taking any action in reliance on or regarding this information. If you have received this fax in error, please notify us immediately by telephone so that we can arrange for its return to [place]. Phone: [phone], Toll-Free: [phone], Fax: [phone] Page: 2 of 2 Call Id: [id number] 11/12/2020 1:11:04 PM See PCP within 24 Hours Yes Conner, RN, Otho Najjar Disagree/Comply Comply Caller Understands Yes PreDisposition Did not know what to do Care Advice Given Per Guideline SEE PCP WITHIN 24 HOURS: * IF OFFICE WILL BE OPEN: You need to be examined within the next 24 hours. Call your doctor (or NP/PA) when the office opens and make an appointment. DRINK FLUIDS: * Drink several glasses of fruit juice, other clear fluids or water. * This will improve hydration and blood glucose. LIE DOWN AND REST: * Lie down with feet elevated for 1 hour. * This will improve circulation and increase blood flow to the brain. CALL BACK IF: * Passes out (faints) * You become worse CARE ADVICE given per Dizziness (Adult) guideline. Comments User: Harvin Hazel, RN Date/Time Eilene Ghazi Time): 11/12/2020 1:18:06 PM Transferred to triage nurse in office Referrals REFERRED TO PCP OFFICE

## 2020-11-12 NOTE — Telephone Encounter (Signed)
Pt transferred to this nurse from Access nurse. Access Nurse had disposition of pt needing to be seen within 24 hours.  Discussed with pt. Pt c/o "low BP" and "felt like blood draining from system and wanting to fall." This occurred once yesterday and pt is feeling better today bur not back to normal completely.  Readings yesterday and today, 116/90, 106/59, 106/68, 112/80. She denies any other symptoms, no CP, SOB, HA, or stabbing pain. Pt ran a home covid test yesterday and it was negative. Pt reports she was at neurologist on 2/23 and they reported BP as normal but she cannot remember the bottom number but she thinks the top number was 120. She reports she takes botox for migraines and also candesartan-HCTZ for migraines also. She also reports a hx of hypothyroid but her OB-GYN is dosing that and that was recently tested a couple of months ago. Advised pt an apt for Mon is available. Scheduled pt and advised of ER precautions. Advised if any symptoms changed or worsened she could go to and UC also. Pt verbalized understanding.    BP Readings from Last 3 Encounters:  08/06/19 110/70  07/31/19 114/72  08/02/16 102/71

## 2020-11-12 NOTE — Telephone Encounter (Signed)
Noted will see Monday

## 2020-11-15 ENCOUNTER — Ambulatory Visit (INDEPENDENT_AMBULATORY_CARE_PROVIDER_SITE_OTHER): Payer: 59 | Admitting: Family Medicine

## 2020-11-15 ENCOUNTER — Other Ambulatory Visit: Payer: Self-pay

## 2020-11-15 VITALS — BP 118/80 | HR 82 | Temp 97.7°F | Ht 62.0 in | Wt 185.8 lb

## 2020-11-15 DIAGNOSIS — I951 Orthostatic hypotension: Secondary | ICD-10-CM

## 2020-11-15 DIAGNOSIS — E782 Mixed hyperlipidemia: Secondary | ICD-10-CM

## 2020-11-15 DIAGNOSIS — G43009 Migraine without aura, not intractable, without status migrainosus: Secondary | ICD-10-CM

## 2020-11-15 DIAGNOSIS — E039 Hypothyroidism, unspecified: Secondary | ICD-10-CM | POA: Diagnosis not present

## 2020-11-15 DIAGNOSIS — H9319 Tinnitus, unspecified ear: Secondary | ICD-10-CM | POA: Insufficient documentation

## 2020-11-15 DIAGNOSIS — E785 Hyperlipidemia, unspecified: Secondary | ICD-10-CM | POA: Insufficient documentation

## 2020-11-15 DIAGNOSIS — R7303 Prediabetes: Secondary | ICD-10-CM

## 2020-11-15 LAB — BASIC METABOLIC PANEL
BUN: 18 mg/dL (ref 6–23)
CO2: 30 mEq/L (ref 19–32)
Calcium: 9.9 mg/dL (ref 8.4–10.5)
Chloride: 103 mEq/L (ref 96–112)
Creatinine, Ser: 0.94 mg/dL (ref 0.40–1.20)
GFR: 68.97 mL/min (ref >60.00)
Glucose, Bld: 88 mg/dL (ref 70–99)
Potassium: 4.4 mEq/L (ref 3.5–5.1)
Sodium: 140 mEq/L (ref 135–145)

## 2020-11-15 LAB — CBC
HCT: 42.3 % (ref 36.0–46.0)
Hemoglobin: 14.1 g/dL (ref 12.0–15.0)
MCHC: 33.3 g/dL (ref 30.0–36.0)
MCV: 87.8 fl (ref 78.0–100.0)
Platelets: 202 10*3/uL (ref 150.0–400.0)
RBC: 4.82 Mil/uL (ref 3.87–5.11)
RDW: 13.7 % (ref 11.5–15.5)
WBC: 5.8 10*3/uL (ref 4.0–10.5)

## 2020-11-15 LAB — TSH: TSH: 3.76 u[IU]/mL (ref 0.35–4.50)

## 2020-11-15 NOTE — Progress Notes (Signed)
Subjective:     Renee Mcdaniel is a 55 y.o. female presenting for Dizziness, Ear Pain (Burning and ringing x 3 weeks ), and Fatigue     HPI  #Ear pain - burning and ringing - x3 weeks - not sure if related to the dizzy spell   #Dizziness - felt "depleated" at work on Thursday - has been able to rest - felt that she was going to faint - episode similar 10 years ago - was diagnosed with vertigo with previous episode - will take valium with her dizzy spells which helps - continues to feel faint but not as bad - symptoms - occur with feeling hot - sensation of "feeling off" while seated - this will trigger a panic that she may faint - trigger - standing up - does not occur with turning over in bed - no room spinning symptoms - water - 3 water bottles per day - decaf coffee - 16-20 oz - no caffeine   Taking candesartan-hctz 8-6.25 daily for migraine prevention  New years eve was treated for a sinus infection - was improving but then got sick a few weeks later and thought maybe this was covid (negative test) - has been feeling fatigued in general since this cold  Review of Systems  Constitutional: Positive for fatigue. Negative for chills and fever.  HENT: Positive for tinnitus. Negative for congestion, sinus pressure and sore throat.      Social History   Tobacco Use  Smoking Status Never Smoker  Smokeless Tobacco Never Used        Objective:    BP Readings from Last 3 Encounters:  11/15/20 118/80  08/06/19 110/70  07/31/19 114/72   Wt Readings from Last 3 Encounters:  11/15/20 185 lb 12 oz (84.3 kg)  10/20/19 189 lb (85.7 kg)  08/06/19 186 lb (84.4 kg)    BP 118/80   Pulse 82   Temp 97.7 F (36.5 C) (Temporal)   Ht 5\' 2"  (1.575 m)   Wt 185 lb 12 oz (84.3 kg)   LMP 04/19/2011   SpO2 99%   BMI 33.97 kg/m    Physical Exam Constitutional:      General: She is not in acute distress.    Appearance: She is well-developed. She is not  diaphoretic.  HENT:     Right Ear: Tympanic membrane and external ear normal.     Left Ear: External ear normal. A middle ear effusion is present. Tympanic membrane is not erythematous, retracted or bulging.     Nose: Nose normal.  Eyes:     Conjunctiva/sclera: Conjunctivae normal.  Cardiovascular:     Rate and Rhythm: Normal rate and regular rhythm.  Pulmonary:     Effort: Pulmonary effort is normal.     Breath sounds: Normal breath sounds.  Musculoskeletal:     Cervical back: Neck supple.  Skin:    General: Skin is warm and dry.     Capillary Refill: Capillary refill takes less than 2 seconds.  Neurological:     Mental Status: She is alert. Mental status is at baseline.  Psychiatric:        Mood and Affect: Mood normal.        Behavior: Behavior normal.           Assessment & Plan:   Problem List Items Addressed This Visit      Cardiovascular and Mediastinum   Migraines    Taking cadesartan-hctz for migraine prevention w/ benefit. Discussed trying  to avoid stopping (see plan) but may need to hold to see if her orthostatic symptoms improve. Cont botox treatment and migraine specialist support.       Orthostatic hypotension - Primary    BP and HR borderline but symptoms of dizziness with position changes on testing today. Advised increasing hydration and even small doses of salty snacks. If BP/dizziness not improving - recommend stopping candesartan-hctz.       Relevant Orders   Basic metabolic panel   CBC     Endocrine   Hypothyroidism    Outside labs with TSH >5 and abnormal but no dose change. She notes 2 months of fatigue following a sinus infection but wonder if causes is worsening thyroid function given absence of sinus symptoms      Relevant Orders   TSH     Other   Prediabetes    Noted on outside labs 5.9. Encouraged diet. Annual monitoring      Hyperlipidemia    Noted on outside labs. Cont atorvastatin 10 mg      Tinnitus    Clear fluid behind  ears. Suspect some allergy driving ETD. Cont loratidine. Add flonase. If no improvement and persisting - ENT referral          Return if symptoms worsen or fail to improve.  Lesleigh Noe, MD  This visit occurred during the SARS-CoV-2 public health emergency.  Safety protocols were in place, including screening questions prior to the visit, additional usage of staff PPE, and extensive cleaning of exam room while observing appropriate contact time as indicated for disinfecting solutions.

## 2020-11-15 NOTE — Assessment & Plan Note (Signed)
Noted on outside labs. Cont atorvastatin 10 mg

## 2020-11-15 NOTE — Assessment & Plan Note (Signed)
Noted on outside labs 5.9. Encouraged diet. Annual monitoring

## 2020-11-15 NOTE — Assessment & Plan Note (Signed)
BP and HR borderline but symptoms of dizziness with position changes on testing today. Advised increasing hydration and even small doses of salty snacks. If BP/dizziness not improving - recommend stopping candesartan-hctz.

## 2020-11-15 NOTE — Assessment & Plan Note (Signed)
Outside labs with TSH >5 and abnormal but no dose change. She notes 2 months of fatigue following a sinus infection but wonder if causes is worsening thyroid function given absence of sinus symptoms

## 2020-11-15 NOTE — Assessment & Plan Note (Signed)
Clear fluid behind ears. Suspect some allergy driving ETD. Cont loratidine. Add flonase. If no improvement and persisting - ENT referral

## 2020-11-15 NOTE — Patient Instructions (Addendum)
#   Ear pressure - continue daily allergy pill - add daily flonase - if no improvement in 2 weeks - call and can refer to ENT  #Dizzy spells -  I think this is blood pressure related - Drink at least 64 oz of water (4 bottles per day, can try for 5 bottles) - if blood pressure still low and feeling dizzy -- add a small amount of salt to food or have a salty snack - If blood pressure remains low and still feeling dizzy would recommend stopping the migraine medication (Candesartan- Hydrochlorothiazide) -- would recommend reaching out to your headache specialist if you end up stopping  If you develop room spinning symptoms or rolling over in bed symptoms - then call or mychart and can plan for ENT referral

## 2020-11-15 NOTE — Assessment & Plan Note (Signed)
Taking cadesartan-hctz for migraine prevention w/ benefit. Discussed trying to avoid stopping (see plan) but may need to hold to see if her orthostatic symptoms improve. Cont botox treatment and migraine specialist support.

## 2020-11-19 ENCOUNTER — Other Ambulatory Visit: Payer: Self-pay

## 2020-11-19 ENCOUNTER — Ambulatory Visit
Admission: RE | Admit: 2020-11-19 | Discharge: 2020-11-19 | Disposition: A | Discharge status: Home / Self Care | Payer: 59 | Source: Ambulatory Visit | Attending: Obstetrics and Gynecology | Admitting: Obstetrics and Gynecology

## 2020-11-24 ENCOUNTER — Encounter: Payer: Self-pay | Admitting: Family Medicine

## 2021-07-05 ENCOUNTER — Other Ambulatory Visit: Payer: Self-pay | Admitting: Obstetrics and Gynecology

## 2021-07-05 DIAGNOSIS — Z1231 Encounter for screening mammogram for malignant neoplasm of breast: Secondary | ICD-10-CM

## 2021-10-31 ENCOUNTER — Ambulatory Visit (INDEPENDENT_AMBULATORY_CARE_PROVIDER_SITE_OTHER): Payer: 59 | Admitting: Family

## 2021-10-31 ENCOUNTER — Other Ambulatory Visit: Payer: Self-pay

## 2021-10-31 ENCOUNTER — Encounter: Payer: Self-pay | Admitting: Family

## 2021-10-31 VITALS — BP 116/76 | HR 106 | Ht 62.0 in | Wt 182.0 lb

## 2021-10-31 DIAGNOSIS — N898 Other specified noninflammatory disorders of vagina: Secondary | ICD-10-CM

## 2021-10-31 DIAGNOSIS — R300 Dysuria: Secondary | ICD-10-CM

## 2021-10-31 DIAGNOSIS — N3000 Acute cystitis without hematuria: Secondary | ICD-10-CM

## 2021-10-31 DIAGNOSIS — R509 Fever, unspecified: Secondary | ICD-10-CM | POA: Diagnosis not present

## 2021-10-31 MED ORDER — FLUCONAZOLE 150 MG PO TABS: ORAL_TABLET | ORAL | 0 refills | Status: DC

## 2021-10-31 MED ORDER — CIPROFLOXACIN HCL 500 MG PO TABS: 500.0000 mg | ORAL_TABLET | Freq: Two times a day (BID) | ORAL | 0 refills | Status: DC

## 2021-10-31 NOTE — Patient Instructions (Signed)
Stop bactrim, start ciprofloxacin and take as directed.  Urine culture pending I will let you know if we need to change any antibiotics.   I have also sent a prescription for Diflucan to your pharmacy for suspected yeast infection.  I have sent out a culture for that as well we will let you know if there are any changes.  Please let me know if you have any worsening symptoms and/or if things do not continue to improve.  It was a pleasure seeing you today! Please do not hesitate to reach out with any questions and or concerns.  Regards,   Eugenia Pancoast FNP-C

## 2021-10-31 NOTE — Assessment & Plan Note (Signed)
Urine culture pending. .

## 2021-10-31 NOTE — Assessment & Plan Note (Signed)
Patient still with symptoms of urinary tract infection despite being on Bactrim so we will discontinue Bactrim and start on ciprofloxacin.  Especially with new onset of fever.  Have ordered a urine culture and pending results.  Increase oral fluids.  Please let me know immediately if any fever chills or worsening symptoms

## 2021-10-31 NOTE — Assessment & Plan Note (Signed)
Ordered covid/flu pending results.

## 2021-10-31 NOTE — Assessment & Plan Note (Signed)
Sending prescription for Diflucan.  Suspected yeast infection.  Have also ordered wet prep pending results

## 2021-10-31 NOTE — Progress Notes (Signed)
Established Patient Office Visit  Subjective:  Patient ID: Renee Mcdaniel, female    DOB: 15-Jun-1966  Age: 56 y.o. MRN: [id number]  CC:  Chief Complaint  Patient presents with   Urinary Tract Infection    Pt stated--UTI--lower abdominal pain, especially when urinating having burning sensation since and vaginal itch since Tuesday. Pt taking bactrim, but not helping.    HPI  Renee Mcdaniel is here today with concerns.    Started 6 days ago with lower abdominal pain, dysuria, and urinary frequency. Had virtual appt that day with doctor who prescribed bactrim which she is taking. Started to have symptom improvement but not complete resolution. Still with urinary frequency to a lesser degree, and still with dysuria. No flank pain. Lower abdominal cramping/tenderness that is intermittent.    No vaginal discharge but feels starting to burn/itch. Suspected yeast per her.  Fever last night up to 100.6 F.  Covid tested negative this am. No other URI symptoms.  Past Medical History:  Diagnosis Date   Breast cancer ([place])    Cancer ([place])    rt. breast ca, stage I   Family history of breast cancer    Family history of lung cancer    Family history of multiple myeloma    Hyperlipidemia    Migraines    chronic   Personal history of radiation therapy    Prediabetes    Thyroid disease    Vertigo     Past Surgical History:  Procedure Laterality Date   BREAST LUMPECTOMY Right    2012   BREAST SURGERY     s/p lumpectomy, sentinel node biopsy, port   CESAREAN SECTION     NASAL SEPTUM SURGERY     PORT-A-CATH REMOVAL     REDUCTION MAMMAPLASTY     left   TONSILLECTOMY      Family History  Problem Relation Age of Onset   Breast cancer Mother 15   Dementia Mother    Thyroid disease Sister    Hyperlipidemia Sister    Thyroid disease Sister    Hyperlipidemia Sister    Endometrial cancer Sister        stage 3   Cancer Maternal Uncle 3       type unknown   Heart disease  Maternal Grandfather        died in 2's   Cancer Paternal Grandmother        type unk, age dx unk   Multiple myeloma Paternal Grandfather 35   Lung cancer Cousin     Social History   Socioeconomic History   Marital status: Married    Spouse name: Dan   Number of children: 2   Years of education: community college   Highest education level: Not on file  Occupational History   Occupation: events coordinator  Tobacco Use   Smoking status: Never   Smokeless tobacco: Never  Vaping Use   Vaping Use: Never used  Substance and Sexual Activity   Alcohol use: No    Comment: Rarely-maybe 2 times a year   Drug use: No   Sexual activity: Yes    Birth control/protection: Post-menopausal  Other Topics Concern   Not on file  Social History Narrative   07/31/19   From: the area   Living: with husband [name] and daughter [name]   Work: [employer], events coordinator      Family: Daughter - [name] (one granddaughter, lives in [place]) and Weeki Wachee (49 yo daughter)  Enjoys: gardening, reading      Exercise: job is pretty demanding - going up and down the stairs   Diet: eating 1-2 times, intermittent fasting to lose weight      Safety   Seat belts: Yes    Guns: Yes  and secure   Safe in relationships: Yes    Social Determinants of Health   Financial Resource Strain: Not on file  Food Insecurity: Not on file  Transportation Needs: Not on file  Physical Activity: Not on file  Stress: Not on file  Social Connections: Not on file  Intimate Partner Violence: Not on file    Outpatient Medications Prior to Visit  Medication Sig Dispense Refill   atorvastatin (LIPITOR) 10 MG tablet Take 10 mg by mouth daily.     Botulinum Toxin Type A (BOTOX) 200 units SOLR As needed for migraines     buPROPion (WELLBUTRIN XL) 300 MG 24 hr tablet Take 1 tablet by mouth daily.     candesartan-hydrochlorothiazide (ATACAND HCT) 16-12.5 MG tablet Take 0.5 tablets by mouth daily. For sinus related  migraines per neurologist.     diazepam (VALIUM) 2 MG tablet Take 2 mg by mouth every 6 (six) hours as needed.     Diclofenac Potassium,Migraine, 50 MG PACK Take 1 each by mouth as needed.     diclofenac sodium (VOLTAREN) 1 % GEL Apply 2 g topically 4 (four) times daily. 100 g 3   levothyroxine (SYNTHROID, LEVOTHROID) 75 MCG tablet Take 75 mcg by mouth daily before breakfast.     lidocaine (XYLOCAINE) 4 % external solution Apply topically as needed. Apply Lidocaine liquid 3 minutes before vaginal penetration. 50 mL 1   Multiple Vitamins-Minerals (WOMENS MULTI VITAMIN & MINERAL PO) Take 1 tablet by mouth daily.     ondansetron (ZOFRAN) 4 MG tablet Take 4 mg by mouth as needed. Reported on 02/23/2016     promethazine (PHENERGAN) 25 MG tablet Take 1 tablet by mouth 3 (three) times daily as needed.     zolpidem (AMBIEN) 10 MG tablet Take 1 tablet by mouth at bedtime as needed.     No facility-administered medications prior to visit.    Allergies  Allergen Reactions   Amoxicillin-Pot Clavulanate Nausea And Vomiting   Oxycodone Nausea Only    ROS Review of Systems  Constitutional:  Positive for chills and fever.  Gastrointestinal:  Negative for abdominal pain.  Genitourinary:  Positive for dysuria, frequency and urgency. Negative for difficulty urinating, flank pain, hematuria, pelvic pain and vaginal discharge (but vaginal itching).  Musculoskeletal:  Negative for arthralgias.     Objective:    Physical Exam Constitutional:      General: She is not in acute distress.    Appearance: Normal appearance. She is normal weight. She is not ill-appearing, toxic-appearing or diaphoretic.  HENT:     Head: Normocephalic.     Right Ear: Tympanic membrane normal.     Left Ear: Tympanic membrane normal.     Nose: Nose normal.     Mouth/Throat:     Mouth: Mucous membranes are moist.  Eyes:     Pupils: Pupils are equal, round, and reactive to light.  Cardiovascular:     Rate and Rhythm: Normal  rate and regular rhythm.  Pulmonary:     Effort: Pulmonary effort is normal.     Breath sounds: Normal breath sounds.  Genitourinary:    General: Normal vulva.     Pubic Area: No rash.  Labia:        Right: No rash, tenderness or lesion.        Left: No rash, tenderness or lesion.     Neurological:     Mental Status: She is alert.    BP 116/76    Pulse (!) 106    Ht '5\' 2"'  (1.575 m)    Wt 182 lb (82.6 kg)    LMP 04/19/2011    SpO2 99%    BMI 33.29 kg/m  Wt Readings from Last 3 Encounters:  10/31/21 182 lb (82.6 kg)  11/15/20 185 lb 12 oz (84.3 kg)  10/20/19 189 lb (85.7 kg)     Health Maintenance Due  Topic Date Due   HIV Screening  Never done   Hepatitis C Screening  Never done   Zoster Vaccines- Shingrix (2 of 2) 09/15/2019   COVID-19 Vaccine (4 - Booster for Pfizer series) 09/16/2020   INFLUENZA VACCINE  04/18/2021    There are no preventive care reminders to display for this patient.  Lab Results  Component Value Date   TSH 3.76 11/15/2020   Lab Results  Component Value Date   WBC 5.8 11/15/2020   HGB 14.1 11/15/2020   HCT 42.3 11/15/2020   MCV 87.8 11/15/2020   PLT 202.0 11/15/2020   Lab Results  Component Value Date   NA 140 11/15/2020   K 4.4 11/15/2020   CHLORIDE 108 08/02/2016   CO2 30 11/15/2020   GLUCOSE 88 11/15/2020   BUN 18 11/15/2020   CREATININE 0.94 11/15/2020   BILITOT 0.44 08/02/2016   ALKPHOS 72 09/06/2020   AST 20 09/06/2020   ALT 32 09/06/2020   PROT 6.6 08/02/2016   ALBUMIN 4.4 09/06/2020   CALCIUM 9.9 11/15/2020   ANIONGAP 9 08/02/2016   EGFR 80 (L) 08/02/2016   GFR 68.97 11/15/2020   Lab Results  Component Value Date   HGBA1C 5.9 09/06/2020      Assessment & Plan:   Problem List Items Addressed This Visit       Musculoskeletal and Integument   Vaginal itching    Sending prescription for Diflucan.  Suspected yeast infection.  Have also ordered wet prep pending results      Relevant Medications    fluconazole (DIFLUCAN) 150 MG tablet   Other Relevant Orders   POCT Wet Prep [facility])     Genitourinary   Acute cystitis without hematuria    Patient still with symptoms of urinary tract infection despite being on Bactrim so we will discontinue Bactrim and start on ciprofloxacin.  Especially with new onset of fever.  Have ordered a urine culture and pending results.  Increase oral fluids.  Please let me know immediately if any fever chills or worsening symptoms      Relevant Medications   ciprofloxacin (CIPRO) 500 MG tablet     Other   Fever   Relevant Orders   COVID-19, Flu A+B and RSV   Dysuria - Primary    Urine culture pending.      Relevant Orders   POCT Wet Prep [facility])   Urine Culture    Meds ordered this encounter  Medications   ciprofloxacin (CIPRO) 500 MG tablet    Sig: Take 1 tablet (500 mg total) by mouth 2 (two) times daily for 3 days.    Dispense:  6 tablet    Refill:  0    Order Specific Question:   Supervising Provider    Answer:   Diona Browner, AMY  E [[id number]]   fluconazole (DIFLUCAN) 150 MG tablet    Sig: Take one po qd for one dose, then repeat in three days if no improvement in symptoms    Dispense:  2 tablet    Refill:  0    Order Specific Question:   Supervising Provider    Answer:   BEDSOLE, AMY E [[id number]]    Follow-up: Return if symptoms worsen or fail to improve.    Eugenia Pancoast, FNP

## 2021-11-01 LAB — WET PREP BY MOLECULAR PROBE
Candida species: NOT DETECTED
Gardnerella vaginalis: NOT DETECTED
MICRO NUMBER:: 13000608
SPECIMEN QUALITY:: ADEQUATE
Trichomonas vaginosis: NOT DETECTED

## 2021-11-02 ENCOUNTER — Encounter: Payer: Self-pay | Admitting: Family

## 2021-11-02 ENCOUNTER — Other Ambulatory Visit: Payer: Self-pay

## 2021-11-02 ENCOUNTER — Encounter: Payer: Self-pay | Admitting: Emergency Medicine

## 2021-11-02 ENCOUNTER — Emergency Department: Payer: 59

## 2021-11-02 ENCOUNTER — Inpatient Hospital Stay
Admission: EM | Admit: 2021-11-02 | Discharge: 2021-11-05 | DRG: 872 | Disposition: A | Discharge status: Home / Self Care | Payer: 59 | Attending: Internal Medicine | Admitting: Internal Medicine

## 2021-11-02 DIAGNOSIS — E039 Hypothyroidism, unspecified: Secondary | ICD-10-CM | POA: Diagnosis present

## 2021-11-02 DIAGNOSIS — Z853 Personal history of malignant neoplasm of breast: Secondary | ICD-10-CM | POA: Diagnosis not present

## 2021-11-02 DIAGNOSIS — E785 Hyperlipidemia, unspecified: Secondary | ICD-10-CM | POA: Diagnosis present

## 2021-11-02 DIAGNOSIS — N390 Urinary tract infection, site not specified: Secondary | ICD-10-CM | POA: Diagnosis not present

## 2021-11-02 DIAGNOSIS — G43909 Migraine, unspecified, not intractable, without status migrainosus: Secondary | ICD-10-CM | POA: Diagnosis present

## 2021-11-02 DIAGNOSIS — Z885 Allergy status to narcotic agent status: Secondary | ICD-10-CM | POA: Diagnosis not present

## 2021-11-02 DIAGNOSIS — Z20822 Contact with and (suspected) exposure to covid-19: Secondary | ICD-10-CM | POA: Diagnosis present

## 2021-11-02 DIAGNOSIS — Z803 Family history of malignant neoplasm of breast: Secondary | ICD-10-CM

## 2021-11-02 DIAGNOSIS — Z8049 Family history of malignant neoplasm of other genital organs: Secondary | ICD-10-CM | POA: Diagnosis not present

## 2021-11-02 DIAGNOSIS — I10 Essential (primary) hypertension: Secondary | ICD-10-CM | POA: Diagnosis present

## 2021-11-02 DIAGNOSIS — R7303 Prediabetes: Secondary | ICD-10-CM | POA: Diagnosis present

## 2021-11-02 DIAGNOSIS — Z79899 Other long term (current) drug therapy: Secondary | ICD-10-CM | POA: Diagnosis not present

## 2021-11-02 DIAGNOSIS — Z8349 Family history of other endocrine, nutritional and metabolic diseases: Secondary | ICD-10-CM

## 2021-11-02 DIAGNOSIS — Z1629 Resistance to other single specified antibiotic: Secondary | ICD-10-CM | POA: Diagnosis present

## 2021-11-02 DIAGNOSIS — Z923 Personal history of irradiation: Secondary | ICD-10-CM | POA: Diagnosis not present

## 2021-11-02 DIAGNOSIS — Z83438 Family history of other disorder of lipoprotein metabolism and other lipidemia: Secondary | ICD-10-CM | POA: Diagnosis not present

## 2021-11-02 DIAGNOSIS — Z8249 Family history of ischemic heart disease and other diseases of the circulatory system: Secondary | ICD-10-CM | POA: Diagnosis not present

## 2021-11-02 DIAGNOSIS — C50411 Malignant neoplasm of upper-outer quadrant of right female breast: Secondary | ICD-10-CM | POA: Diagnosis present

## 2021-11-02 DIAGNOSIS — A419 Sepsis, unspecified organism: Secondary | ICD-10-CM | POA: Diagnosis present

## 2021-11-02 DIAGNOSIS — N10 Acute tubulo-interstitial nephritis: Secondary | ICD-10-CM | POA: Diagnosis present

## 2021-11-02 DIAGNOSIS — R109 Unspecified abdominal pain: Secondary | ICD-10-CM | POA: Diagnosis not present

## 2021-11-02 DIAGNOSIS — E782 Mixed hyperlipidemia: Secondary | ICD-10-CM | POA: Diagnosis not present

## 2021-11-02 DIAGNOSIS — Z880 Allergy status to penicillin: Secondary | ICD-10-CM

## 2021-11-02 DIAGNOSIS — Z801 Family history of malignant neoplasm of trachea, bronchus and lung: Secondary | ICD-10-CM | POA: Diagnosis not present

## 2021-11-02 DIAGNOSIS — Z807 Family history of other malignant neoplasms of lymphoid, hematopoietic and related tissues: Secondary | ICD-10-CM

## 2021-11-02 DIAGNOSIS — Z79890 Hormone replacement therapy (postmenopausal): Secondary | ICD-10-CM | POA: Diagnosis not present

## 2021-11-02 LAB — COVID-19, FLU A+B AND RSV
Influenza A, NAA: NOT DETECTED
Influenza B, NAA: NOT DETECTED
RSV, NAA: NOT DETECTED
SARS-CoV-2, NAA: NOT DETECTED

## 2021-11-02 LAB — URINE CULTURE
MICRO NUMBER:: 13000550
SPECIMEN QUALITY:: ADEQUATE

## 2021-11-02 LAB — CBC
HCT: 39.2 % (ref 36.0–46.0)
Hemoglobin: 12.9 g/dL (ref 12.0–15.0)
MCH: 28.7 pg (ref 26.0–34.0)
MCHC: 32.9 g/dL (ref 30.0–36.0)
MCV: 87.3 fL (ref 80.0–100.0)
Platelets: 217 10*3/uL (ref 150–400)
RBC: 4.49 MIL/uL (ref 3.87–5.11)
RDW: 13.2 % (ref 11.5–15.5)
WBC: 12.6 10*3/uL — ABNORMAL HIGH (ref 4.0–10.5)
nRBC: 0 % (ref 0.0–0.2)

## 2021-11-02 LAB — HEPATIC FUNCTION PANEL
ALT: 15 U/L (ref 0–44)
AST: 14 U/L — ABNORMAL LOW (ref 15–41)
Albumin: 3.6 g/dL (ref 3.5–5.0)
Alkaline Phosphatase: 68 U/L (ref 38–126)
Bilirubin, Direct: 0.1 mg/dL (ref 0.0–0.2)
Indirect Bilirubin: 0.8 mg/dL (ref 0.3–0.9)
Total Bilirubin: 0.9 mg/dL (ref 0.3–1.2)
Total Protein: 7 g/dL (ref 6.5–8.1)

## 2021-11-02 LAB — BASIC METABOLIC PANEL
Anion gap: 10 (ref 5–15)
BUN: 14 mg/dL (ref 6–20)
CO2: 25 mmol/L (ref 22–32)
Calcium: 9.4 mg/dL (ref 8.9–10.3)
Chloride: 98 mmol/L (ref 98–111)
Creatinine, Ser: 1.25 mg/dL — ABNORMAL HIGH (ref 0.44–1.00)
GFR, Estimated: 51 mL/min — ABNORMAL LOW (ref >60)
Glucose, Bld: 122 mg/dL — ABNORMAL HIGH (ref 70–99)
Potassium: 4.4 mmol/L (ref 3.5–5.1)
Sodium: 133 mmol/L — ABNORMAL LOW (ref 135–145)

## 2021-11-02 LAB — LIPASE, BLOOD: Lipase: 22 U/L (ref 11–51)

## 2021-11-02 LAB — RESP PANEL BY RT-PCR (FLU A&B, COVID) ARPGX2
Influenza A by PCR: NEGATIVE
Influenza B by PCR: NEGATIVE
SARS Coronavirus 2 by RT PCR: NEGATIVE

## 2021-11-02 LAB — LACTIC ACID, PLASMA: Lactic Acid, Venous: 1.3 mmol/L (ref 0.5–1.9)

## 2021-11-02 MED ORDER — CEFTRIAXONE SODIUM 2 G IJ SOLR
2.0000 g | Freq: Once | INTRAMUSCULAR | Status: AC
  Administered 2021-11-02: 2 g via INTRAVENOUS
  Filled 2021-11-02: qty 20

## 2021-11-02 MED ORDER — ACETAMINOPHEN 500 MG PO TABS
1000.0000 mg | ORAL_TABLET | Freq: Once | ORAL | Status: AC
Start: 2021-11-02 — End: 2021-11-02
  Administered 2021-11-02: 1000 mg via ORAL
  Filled 2021-11-02: qty 2

## 2021-11-02 MED ORDER — SODIUM CHLORIDE 0.9 % IV BOLUS
1000.0000 mL | Freq: Once | INTRAVENOUS | Status: AC
  Administered 2021-11-02: 1000 mL via INTRAVENOUS

## 2021-11-02 MED ORDER — ONDANSETRON HCL 4 MG/2ML IJ SOLN
4.0000 mg | Freq: Once | INTRAMUSCULAR | Status: DC
  Filled 2021-11-02: qty 2

## 2021-11-02 MED ORDER — IOHEXOL 300 MG/ML  SOLN
100.0000 mL | Freq: Once | INTRAMUSCULAR | Status: AC | PRN
  Administered 2021-11-02: 100 mL via INTRAVENOUS

## 2021-11-02 MED ORDER — FENTANYL CITRATE PF 50 MCG/ML IJ SOSY
50.0000 ug | PREFILLED_SYRINGE | Freq: Once | INTRAMUSCULAR | Status: AC
  Administered 2021-11-02: 50 ug via INTRAVENOUS
  Filled 2021-11-02: qty 1

## 2021-11-02 NOTE — Assessment & Plan Note (Addendum)
No acute issues Outpatient follow-up.

## 2021-11-02 NOTE — Assessment & Plan Note (Addendum)
-   Patient statin was held during the hospitalization and will be resumed 3 days post discharge.

## 2021-11-02 NOTE — Assessment & Plan Note (Addendum)
-   Blood pressure borderline on admission however improved with hydration.. -Patient stated was on Atacand HCTZ for migraines per PCP which has subsequently been discontinued. -Blood pressure remained stable throughout the hospitalization. -Outpatient follow-up.

## 2021-11-02 NOTE — ED Provider Notes (Signed)
[facility] Provider Note    Event Date/Time   First MD Initiated Contact with Patient 11/02/21 2138     (approximate)   History   Flank Pain   HPI  Renee Mcdaniel is a 56 y.o. female with thyroid disease, migraines, history of breast cancer who comes in with concern for flank pain.  Patient's been having urinary symptoms and was started on Bactrim last Tuesday.  Had taken most of the course but was still feeling really bad and follow-up with her primary care doctor and switched to ciprofloxacin.  Her urine culture came back and was resistant to Bactrim.  She reports that even with the Cipro she is continue to feel very unwell.  She states that she continues to have fevers, nausea, abdominal pain.  The abdominal pain is mostly on the left side of her abdomen and up into her left kidney.  She reports continuous fevers.  Has not taken Tylenol yet today   Physical Exam   Triage Vital Signs: ED Triage Vitals  Enc Vitals Group     BP 11/02/21 2130 111/80     Pulse Rate 11/02/21 2130 (!) 103     Resp --      Temp 11/02/21 2130 100.2 F (37.9 C)     Temp Source 11/02/21 2130 Oral     SpO2 11/02/21 2130 99 %     Weight 11/02/21 2137 180 lb (81.6 kg)     Height 11/02/21 2137 5\' 2"  (1.575 m)     Head Circumference --      Peak Flow --      Pain Score 11/02/21 2136 4     Pain Loc --      Pain Edu? --      Excl. in [place]? --     Most recent vital signs: Vitals:   11/02/21 2130  BP: 111/80  Pulse: (!) 103  Temp: 100.2 F (37.9 C)  SpO2: 99%     General: Awake, no distress.  CV:  Good peripheral perfusion.  Tachycardic Resp:  Normal effort.  Clear lungs Abd:  No distention.  Tender left lower quadrant   ED Results / Procedures / Treatments   Labs (all labs ordered are listed, but only abnormal results are displayed) Labs Reviewed  CULTURE, BLOOD (ROUTINE X 2)  CULTURE, BLOOD (ROUTINE X 2)  URINE CULTURE  RESP PANEL BY RT-PCR (FLU A&B,  COVID) ARPGX2  URINALYSIS, ROUTINE W REFLEX MICROSCOPIC  BASIC METABOLIC PANEL  CBC  LACTIC ACID, PLASMA  LACTIC ACID, PLASMA  HEPATIC FUNCTION PANEL  LIPASE, BLOOD     EKG  My interpretation of EKG:  Normal sinus rate of 87 without any ST elevation or T wave versions, normal intervals  RADIOLOGY I have reviewed the CT personally pending read from rads    PROCEDURES:  Critical Care performed: Yes, see critical care procedure note(s)  .1-3 Lead EKG Interpretation Performed by: Vanessa Goree, MD Authorized by: Vanessa [place]ham, MD     Interpretation: normal     ECG rate:  80   ECG rate assessment: normal     Rhythm: sinus rhythm     Ectopy: none     Conduction: normal   Comments:     Initially sinus tachycardia but getting better after fluid .Critical Care Performed by: Vanessa Tiffin, MD Authorized by: Vanessa , MD   Critical care provider statement:    Critical care time (minutes):  30  Critical care was necessary to treat or prevent imminent or life-threatening deterioration of the following conditions:  Sepsis   Critical care was time spent personally by me on the following activities:  Development of treatment plan with patient or surrogate, discussions with consultants, evaluation of patient's response to treatment, examination of patient, ordering and review of laboratory studies, ordering and review of radiographic studies, ordering and performing treatments and interventions, pulse oximetry, re-evaluation of patient's condition and review of old [place] ED: Medications  cefTRIAXone (ROCEPHIN) 2 g in sodium chloride 0.9 % 100 mL IVPB (2 g Intravenous New Bag/Given 11/02/21 2222)  iohexol (OMNIPAQUE) 300 MG/ML solution 100 mL (has no administration in time range)  sodium chloride 0.9 % bolus 1,000 mL (1,000 mLs Intravenous New Bag/Given 11/02/21 2211)  fentaNYL (SUBLIMAZE) injection 50 mcg (50 mcg Intravenous Given 11/02/21 2214)   acetaminophen (TYLENOL) tablet 1,000 mg (1,000 mg Oral Given 11/02/21 2225)     IMPRESSION / MDM / [place] / ED COURSE  I reviewed the triage vital signs and the nursing notes.                              Differential diagnosis includes, but is not limited to, UTI, pyelonephritis, kidney abscess, kidney stone.  We will get CT imaging to further evaluate.  Patient almost meets sepsis criteria therefore we will start some fluids, give Tylenol for low-grade temperature, fentanyl for pain.  We will give a dose of ceftriaxone for known E. coli UTI.  Will get labs evaluate for any electro abnormalities, AKI.  BMP shows slightly elevated kidney function and slightly low sodium but patient is already given some fluids Lactate is normal Lipase is normal CBC shows elevated white count therefore patient does meet sepsis criteria with elevated white count and tachycardia.  Patient is already given 2 g of ceftriaxone for presumed UTI.  Sepsis alert was called.  Patient ended off to oncoming team pending CT imaging and reassessment most likely admission given doing well with outpt oral therapy.    The patient is on the cardiac monitor to evaluate for evidence of arrhythmia and/or significant heart rate changes.  FINAL CLINICAL IMPRESSION(S) / ED DIAGNOSES   Final diagnoses:  Urinary tract infection without hematuria, site unspecified  Sepsis, due to unspecified organism, unspecified whether acute organ dysfunction present [facility])     Rx / DC Orders   ED Discharge Orders     None        Note:  This document was prepared using Dragon voice recognition software and may include unintentional dictation errors.   Vanessa [place], MD 11/02/21 2246

## 2021-11-02 NOTE — Assessment & Plan Note (Addendum)
-  Patient maintained on home regimen Synthroid.    ?

## 2021-11-02 NOTE — Progress Notes (Signed)
CODE SEPSIS - PHARMACY COMMUNICATION  **Broad Spectrum Antibiotics should be administered within 1 hour of Sepsis diagnosis**  Time Code Sepsis Called/Page Received: 2/15 @ 2246  Antibiotics Ordered: Ceftriaxone 2 gm IV   Time of 1st antibiotic administration: Ceftriaxone 2 gm IV X 1 on 2/15 @ 2222  Additional action taken by pharmacy:   If necessary, Name of Provider/Nurse Contacted:     Rella Egelston D ,PharmD Clinical Pharmacist  11/02/2021  11:02 PM

## 2021-11-02 NOTE — Assessment & Plan Note (Addendum)
-  Patient failed Bactrim and ?? Cipro outpatient settings. -Patient presented with ongoing dysuria, left-sided flank pain noted to have a temp of 100.2 in the ED, some tachycardia.  Patient also noted with a leukocytosis. -Urinalysis with large leukocytes, nitrite negative, WBC > 50. -Urine cultures pending -CT abdomen and pelvis done concerning for acute pyelonephritis.  -Urine cultures from 10/31/2021 with > 100,000 colonies of E. coli, resistant to Bactrim, ampicillin and sensitive to the cephalosporins, ciprofloxacin, Macrobid. -Patient was placed empirically on IV Rocephin on admission, improved clinically and subsequently transition to oral ciprofloxacin which she tolerated. -Urine cultures obtained on presentation to the ED were negative however patient had been on oral antibiotics prior to admission.  -Patient also maintained on IV fluids, IV antiemetics and supportive care.  -Patient improved clinically and will be discharged home in stable and improved condition on 7 more days of oral ciprofloxacin to complete a 10-day course of antibiotic treatment.  -Outpatient follow-up with PCP.

## 2021-11-02 NOTE — H&P (Signed)
History and Physical    Patient: Renee Mcdaniel YDX:[id number] DOB: July 02, 1966 DOA: 11/02/2021 DOS: the patient was seen and examined on 11/02/2021 PCP: Lesleigh Noe, MD  Patient coming from: Home  Chief Complaint:  Chief Complaint  Patient presents with   Flank Pain    HPI: Renee Mcdaniel is a 56 y.o. female with medical history significant of Breast cancer, hypothyroidism, migraines and HLD who presents to the ED for evaluation of flank pain.  Patient recently saw her PCP via televisit and was started on Bactrim a week ago but symptoms persisted.  On follow-up 2 days ago, she was switched to Cipro.  She continues to feel unwell and started having fevers, nausea and persistent left-sided flank pain prompting the visit to the ED.  ED course: On arrival temperature 100.2, pulse 103 with BP 111/80 Blood work with WBC 12,000, lactic acid 1.3 Creatinine 1.25, up from baseline of 0.94 Lipase and LFTs WNL CT abdomen and pelvis consistent with acute pyelonephritis  Patient started on Rocephin and sepsis fluids.  Hospitalist consulted for admission.   Review of Systems: As mentioned in the history of present illness. All other systems reviewed and are negative. Past Medical History:  Diagnosis Date   Breast cancer ([place])    Cancer ([place])    rt. breast ca, stage I   Family history of breast cancer    Family history of lung cancer    Family history of multiple myeloma    Hyperlipidemia    Migraines    chronic   Personal history of radiation therapy    Prediabetes    Thyroid disease    Vertigo    Past Surgical History:  Procedure Laterality Date   BREAST LUMPECTOMY Right    2012   BREAST SURGERY     s/p lumpectomy, sentinel node biopsy, port   CESAREAN SECTION     NASAL SEPTUM SURGERY     PORT-A-CATH REMOVAL     REDUCTION MAMMAPLASTY     left   TONSILLECTOMY     Social History:  reports that she has never smoked. She has never used smokeless tobacco. She reports that  she does not drink alcohol and does not use drugs.  Allergies  Allergen Reactions   Amoxicillin-Pot Clavulanate Nausea And Vomiting   Oxycodone Nausea Only    Family History  Problem Relation Age of Onset   Breast cancer Mother 66   Dementia Mother    Thyroid disease Sister    Hyperlipidemia Sister    Thyroid disease Sister    Hyperlipidemia Sister    Endometrial cancer Sister        stage 3   Cancer Maternal Uncle 3       type unknown   Heart disease Maternal Grandfather        died in 30's   Cancer Paternal Grandmother        type unk, age dx unk   Multiple myeloma Paternal Grandfather 53   Lung cancer Cousin     Prior to Admission medications   Medication Sig Start Date End Date Taking? Authorizing Provider  atorvastatin (LIPITOR) 10 MG tablet Take 10 mg by mouth daily.    [provider]  Botulinum Toxin Type A (BOTOX) 200 units SOLR As needed for migraines    [provider]  buPROPion (WELLBUTRIN XL) 300 MG 24 hr tablet Take 1 tablet by mouth daily.    [provider]  candesartan-hydrochlorothiazide (ATACAND HCT) 16-12.5 MG tablet Take  0.5 tablets by mouth daily. For sinus related migraines per neurologist.    [provider]  ciprofloxacin (CIPRO) 500 MG tablet Take 1 tablet (500 mg total) by mouth 2 (two) times daily for 3 days. 10/31/21 11/03/21  Eugenia Pancoast, FNP  diazepam (VALIUM) 2 MG tablet Take 2 mg by mouth every 6 (six) hours as needed.    [provider]  Diclofenac Potassium,Migraine, 50 MG PACK Take 1 each by mouth as needed.    [provider]  diclofenac sodium (VOLTAREN) 1 % GEL Apply 2 g topically 4 (four) times daily. 03/04/18   Mcarthur Rossetti, MD  fluconazole (DIFLUCAN) 150 MG tablet Take one po qd for one dose, then repeat in three days if no improvement in symptoms 10/31/21   Eugenia Pancoast, FNP  levothyroxine (SYNTHROID, LEVOTHROID) 75 MCG tablet Take 75 mcg by mouth daily before  breakfast.    [provider]  lidocaine (XYLOCAINE) 4 % external solution Apply topically as needed. Apply Lidocaine liquid 3 minutes before vaginal penetration. 04/01/15   Boelter, Genelle Gather, NP  Multiple Vitamins-Minerals (WOMENS MULTI VITAMIN & MINERAL PO) Take 1 tablet by mouth daily.    [provider]  ondansetron (ZOFRAN) 4 MG tablet Take 4 mg by mouth as needed. Reported on 02/23/2016 03/03/11   [provider]  promethazine (PHENERGAN) 25 MG tablet Take 1 tablet by mouth 3 (three) times daily as needed. 09/19/19   [provider]  zolpidem (AMBIEN) 10 MG tablet Take 1 tablet by mouth at bedtime as needed.    [provider]    Physical Exam: Vitals:   11/02/21 2137 11/02/21 2223 11/02/21 2229 11/02/21 2230  BP:  127/83  138/80  Pulse:  88  88  Resp:  _0 Temp:      TempSrc:      SpO2:  96%  97%  Weight: 81.6 kg     Height: _1  (1.575 m)      Physical Exam Vitals and nursing note reviewed.  Constitutional:      General: She is not in acute distress.    Appearance: Normal appearance.  HENT:     Head: Normocephalic and atraumatic.  Cardiovascular:     Rate and Rhythm: Normal rate and regular rhythm.     Pulses: Normal pulses.     Heart sounds: Normal heart sounds. No murmur heard. Pulmonary:     Effort: Pulmonary effort is normal.     Breath sounds: Normal breath sounds. No wheezing or rhonchi.  Abdominal:     General: Bowel sounds are normal.     Palpations: Abdomen is soft.     Tenderness: There is no abdominal tenderness. There is left CVA tenderness.  Musculoskeletal:        General: No swelling or tenderness. Normal range of motion.     Cervical back: Normal range of motion and neck supple.  Skin:    General: Skin is warm and dry.  Neurological:     General: No focal deficit present.     Mental Status: She is alert. Mental status is at baseline.  Psychiatric:        Mood and Affect: Mood normal.         Behavior: Behavior normal.     Data Reviewed: Notes from primary care and specialist visits, past discharge summaries. Prior diagnostic testing as applicable to current admission diagnoses Updated medications and problem lists for reconciliation ED course, including vitals, labs, imaging, treatment  and response to treatment Triage notes and ED providers notes   Assessment and Plan: * Acute pyelonephritis Patient failed Bactrim and Cipro outpatient IV Rocephin Follow cultures Pain control, IV antiemetics Supportive care  Sepsis ([place]) Continue sepsis fluids Treat acute pyelonephritis as outlined above  HTN (hypertension) Continue candesartan hydrochlorothiazide  Hypothyroidism- (present on admission) Continue levothyroxine  Hyperlipidemia- (present on admission) Continue atorvastatin  Breast cancer of upper-outer quadrant of right female breast ([place])- (present on admission) No acute issues       Advance Care Planning:   Code Status: Not on file   Consults:   Family Communication:   Severity of Illness: The appropriate patient status for this patient is INPATIENT. Inpatient status is judged to be reasonable and necessary in order to provide the required intensity of service to ensure the patient's safety. The patient's presenting symptoms, physical exam findings, and initial radiographic and laboratory data in the context of their chronic comorbidities is felt to place them at high risk for further clinical deterioration. Furthermore, it is not anticipated that the patient will be medically stable for discharge from the hospital within 2 midnights of admission.   * I certify that at the point of admission it is my clinical judgment that the patient will require inpatient hospital care spanning beyond 2 midnights from the point of admission due to high intensity of service, high risk for further deterioration and high frequency of surveillance  required.*  Author: Athena Masse, MD 11/02/2021 11:54 PM  For on call review [email or web address].

## 2021-11-02 NOTE — ED Triage Notes (Signed)
Pt to ED from home c/o left flank pain that started today.  States was being treated for UTI started last Tuesday.  States pain with urination, denies hematuria.  N/v at home, denies diarrhea.

## 2021-11-02 NOTE — ED Notes (Signed)
ED Provider at bedside. 

## 2021-11-02 NOTE — Sepsis Progress Note (Signed)
Following per sepsis protocol   

## 2021-11-02 NOTE — Progress Notes (Signed)
Pending susceptibility report patient currently taking antibiotics

## 2021-11-02 NOTE — Assessment & Plan Note (Addendum)
-  On admission patient met criteria for sepsis with leukocytosis, tachycardia, acute pyelonephritis.   -Patient pancultured with no growth to date. -Patient remained afebrile and improved clinically during the hospitalization with resolution of leukocytosis. -Patient placed empirically on IV antibiotics, IV antiemetics, IV fluids and supportive care. -As patient improved clinically was transitioned to oral antibiotics which she will be discharged home on. -See acute pyelonephritis above.

## 2021-11-03 DIAGNOSIS — I10 Essential (primary) hypertension: Secondary | ICD-10-CM | POA: Diagnosis not present

## 2021-11-03 DIAGNOSIS — N390 Urinary tract infection, site not specified: Secondary | ICD-10-CM | POA: Diagnosis not present

## 2021-11-03 DIAGNOSIS — E782 Mixed hyperlipidemia: Secondary | ICD-10-CM | POA: Diagnosis not present

## 2021-11-03 DIAGNOSIS — A419 Sepsis, unspecified organism: Principal | ICD-10-CM

## 2021-11-03 DIAGNOSIS — E039 Hypothyroidism, unspecified: Secondary | ICD-10-CM

## 2021-11-03 DIAGNOSIS — N10 Acute tubulo-interstitial nephritis: Secondary | ICD-10-CM | POA: Diagnosis not present

## 2021-11-03 LAB — URINALYSIS, ROUTINE W REFLEX MICROSCOPIC
Bacteria, UA: NONE SEEN
Bilirubin Urine: NEGATIVE
Glucose, UA: NEGATIVE mg/dL
Ketones, ur: 5 mg/dL — AB
Nitrite: NEGATIVE
Protein, ur: NEGATIVE mg/dL
Specific Gravity, Urine: 1.033 — ABNORMAL HIGH (ref 1.005–1.030)
WBC, UA: >50 WBC/hpf — ABNORMAL HIGH (ref 0–5)
pH: 7 (ref 5.0–8.0)

## 2021-11-03 LAB — BASIC METABOLIC PANEL
Anion gap: 6 (ref 5–15)
BUN: 12 mg/dL (ref 6–20)
CO2: 28 mmol/L (ref 22–32)
Calcium: 8.8 mg/dL — ABNORMAL LOW (ref 8.9–10.3)
Chloride: 102 mmol/L (ref 98–111)
Creatinine, Ser: 1.2 mg/dL — ABNORMAL HIGH (ref 0.44–1.00)
GFR, Estimated: 53 mL/min — ABNORMAL LOW (ref >60)
Glucose, Bld: 110 mg/dL — ABNORMAL HIGH (ref 70–99)
Potassium: 4.4 mmol/L (ref 3.5–5.1)
Sodium: 136 mmol/L (ref 135–145)

## 2021-11-03 LAB — CBC
HCT: 37.4 % (ref 36.0–46.0)
Hemoglobin: 12.1 g/dL (ref 12.0–15.0)
MCH: 29 pg (ref 26.0–34.0)
MCHC: 32.4 g/dL (ref 30.0–36.0)
MCV: 89.7 fL (ref 80.0–100.0)
Platelets: 201 10*3/uL (ref 150–400)
RBC: 4.17 MIL/uL (ref 3.87–5.11)
RDW: 13.3 % (ref 11.5–15.5)
WBC: 10.7 10*3/uL — ABNORMAL HIGH (ref 4.0–10.5)
nRBC: 0 % (ref 0.0–0.2)

## 2021-11-03 LAB — HIV ANTIBODY (ROUTINE TESTING W REFLEX): HIV Screen 4th Generation wRfx: NONREACTIVE

## 2021-11-03 LAB — LACTIC ACID, PLASMA: Lactic Acid, Venous: 1 mmol/L (ref 0.5–1.9)

## 2021-11-03 MED ORDER — DIPHENHYDRAMINE HCL 25 MG PO CAPS
25.0000 mg | ORAL_CAPSULE | Freq: Once | ORAL | Status: AC
  Administered 2021-11-03: 25 mg via ORAL
  Filled 2021-11-03: qty 1

## 2021-11-03 MED ORDER — LACTATED RINGERS IV SOLN: INTRAVENOUS | Status: DC

## 2021-11-03 MED ORDER — ONDANSETRON HCL 4 MG PO TABS: 4.0000 mg | ORAL_TABLET | Freq: Four times a day (QID) | ORAL | Status: DC | PRN

## 2021-11-03 MED ORDER — LEVOTHYROXINE SODIUM 50 MCG PO TABS
75.0000 ug | ORAL_TABLET | Freq: Every day | ORAL | Status: DC
  Administered 2021-11-03 – 2021-11-05 (×3): 75 ug via ORAL
  Filled 2021-11-03 (×3): qty 1

## 2021-11-03 MED ORDER — ACETAMINOPHEN 650 MG RE SUPP: 650.0000 mg | Freq: Four times a day (QID) | RECTAL | Status: DC | PRN

## 2021-11-03 MED ORDER — ONDANSETRON HCL 4 MG/2ML IJ SOLN: 4.0000 mg | Freq: Four times a day (QID) | INTRAMUSCULAR | Status: DC | PRN

## 2021-11-03 MED ORDER — ALPRAZOLAM 0.5 MG PO TABS: 0.5000 mg | ORAL_TABLET | Freq: Every day | ORAL | Status: DC | PRN

## 2021-11-03 MED ORDER — BUPROPION HCL ER (XL) 150 MG PO TB24
300.0000 mg | ORAL_TABLET | Freq: Every day | ORAL | Status: DC
  Administered 2021-11-03 – 2021-11-05 (×3): 300 mg via ORAL
  Filled 2021-11-03 (×3): qty 2

## 2021-11-03 MED ORDER — SODIUM CHLORIDE 0.9 % IV SOLN
2.0000 g | INTRAVENOUS | Status: DC
  Administered 2021-11-03 – 2021-11-04 (×2): 2 g via INTRAVENOUS
  Filled 2021-11-03: qty 2
  Filled 2021-11-03 (×3): qty 20

## 2021-11-03 MED ORDER — ENOXAPARIN SODIUM 40 MG/0.4ML IJ SOSY
40.0000 mg | PREFILLED_SYRINGE | INTRAMUSCULAR | Status: DC
  Filled 2021-11-03: qty 0.4

## 2021-11-03 MED ORDER — ACETAMINOPHEN 325 MG PO TABS: 650.0000 mg | ORAL_TABLET | Freq: Four times a day (QID) | ORAL | Status: DC | PRN

## 2021-11-03 NOTE — Progress Notes (Signed)
Cross Cover Patient requesting benadryl for sleep. Xanax discontinued and benadryl ordered

## 2021-11-03 NOTE — Progress Notes (Signed)
PROGRESS NOTE    Renee Mcdaniel  OJJ:[id number] DOB: 1966-09-15 DOA: 11/02/2021 PCP: Lesleigh Noe, MD    Chief Complaint  Patient presents with   Flank Pain    Brief Narrative:  Patient is 56 year old pleasant female history of breast cancer, hypothyroidism, migraines, hyperlipidemia presented with flank pain.  Patient noted to have recently been diagnosed with a UTI, placed on Bactrim and completed 1 week course however due to ongoing symptoms was seen by PCP 2 2 days prior to admission and patient placed on Cipro however due to ongoing symptoms of fevers, nausea and persistent left sided flank pain patient presented to the ED.  Work-up done including CT abdomen and pelvis concerning for acute pyelonephritis which had failed outpatient treatment.  Patient placed on empiric IV Rocephin and admitted for further evaluation and management.    Assessment & Plan:   Assessment and Plan: * Acute pyelonephritis -Patient failed Bactrim and Cipro outpatient settings. -Patient presented with ongoing dysuria, left-sided flank pain noted to have a temp of 100.2 in the ED, some tachycardia.  Patient also noted with a leukocytosis. -Urinalysis with large leukocytes, nitrite negative, WBC > 50. -Urine cultures pending -CT abdomen and pelvis done concerning for acute pyelonephritis.  -Urine cultures from 10/31/2021 with > 100,000 colonies of E. coli, resistant to Bactrim, ampicillin and sensitive to the cephalosporins, ciprofloxacin, Macrobid. -Continue IV Rocephin, IV fluids, pain management, supportive care.  HTN (hypertension) - Blood pressure borderline. -Patient states was on Atacand HCTZ for migraines.   -Hold antihypertensive medications.   Sepsis ([place]) - On admission patient met criteria for sepsis with leukocytosis, tachycardia, acute pyelonephritis.   -Continue IV antibiotics, IV fluids.   -See acute pyelonephritis as outlined above.  Hypothyroidism- (present on admission) -  Continue home regimen Synthroid.  Hyperlipidemia- (present on admission) - Hold statin once improved clinically may resume on discharge.  Breast cancer of upper-outer quadrant of right female breast [facility])- (present on admission) No acute issues Outpatient follow-up.         DVT prophylaxis: Lovenox Code Status: Full Family Communication: Updated patient.  No family at bedside. Disposition: Home when clinically improved.  Status is: Inpatient Remains inpatient appropriate because: Severity of illness           Consultants:  None  Procedures:  CT abdomen and pelvis 11/02/2021   Antimicrobials:  IV Rocephin 11/02/2021   Subjective: Feeling better. Dysuria improving, Flank pain improving.  Still with some suprapubic abdominal pain.  Still with some nausea but able to tolerate a little bit of breakfast today.  No emesis.  Objective: Vitals:   11/03/21 0330 11/03/21 0605 11/03/21 1000 11/03/21 1112  BP: 118/84 (!) 102/52 108/80 133/88  Pulse: 84 82 84 (!) 101  Resp: '16 15 18 16  ' Temp:   99.2 F (37.3 C) 98.7 F (37.1 C)  TempSrc:   Oral   SpO2: 99% 96% 98% 97%  Weight:      Height:        Intake/Output Summary (Last 24 hours) at 11/03/2021 1542 Last data filed at 11/03/2021 1421 Gross per 24 hour  Intake 1099 ml  Output --  Net 1099 ml   Filed Weights   11/02/21 2137  Weight: 81.6 kg    Examination:  General exam: Appears calm and comfortable  Respiratory system: Clear to auscultation. Respiratory effort normal. Cardiovascular system: S1 & S2 heard, RRR. No JVD, murmurs, rubs, gallops or clicks. No pedal edema. Gastrointestinal system: Abdomen is nondistended, soft and  some tenderness to palpation in the suprapubic region.  Positive bowel sounds.  No rebound.  No guarding.  No CVA tenderness to palpation Central nervous system: Alert and oriented. No focal neurological deficits. Extremities: Symmetric 5 x 5 power. Skin: No rashes, lesions or  ulcers Psychiatry: Judgement and insight appear normal. Mood & affect appropriate.     Data Reviewed:   CBC: Recent Labs  Lab 11/02/21 2154 11/03/21 0401  WBC 12.6* 10.7*  HGB 12.9 12.1  HCT 39.2 37.4  MCV 87.3 89.7  PLT 217 347    Basic Metabolic Panel: Recent Labs  Lab 11/02/21 2154 11/03/21 0401  NA 133* 136  K 4.4 4.4  CL 98 102  CO2 25 28  GLUCOSE 122* 110*  BUN 14 12  CREATININE 1.25* 1.20*  CALCIUM 9.4 8.8*    GFR: Estimated Creatinine Clearance: 52.4 mL/min (A) (by C-G formula based on SCr of 1.2 mg/dL (H)).  Liver Function Tests: Recent Labs  Lab 11/02/21 2155  AST 14*  ALT 15  ALKPHOS 68  BILITOT 0.9  PROT 7.0  ALBUMIN 3.6    CBG: No results for input(s): GLUCAP in the last 168 hours.   Recent Results (from the past 240 hour(s))  Urine Culture     Status: Abnormal   Collection Time: 10/31/21 10:39 AM   Specimen: Urine  Result Value Ref Range Status   MICRO NUMBER: [id number]  Final   SPECIMEN QUALITY: Adequate  Final   Sample Source URINE  Final   STATUS: FINAL  Final   ISOLATE 1: Escherichia coli (A)  Final    Comment: Greater than 100,000 CFU/mL of Escherichia coli      Susceptibility   Escherichia coli - URINE CULTURE, REFLEX    AMOX/CLAVULANIC 8 Sensitive     AMPICILLIN >=32 Resistant     AMPICILLIN/SULBACTAM 16 Intermediate     CEFAZOLIN* <=4 Not Reportable      * For infections other than uncomplicated UTI caused by E. coli, K. pneumoniae or P. mirabilis: Cefazolin is resistant if MIC > or = 8 mcg/mL. (Distinguishing susceptible versus intermediate for isolates with MIC < or = 4 mcg/mL requires additional testing.) For uncomplicated UTI caused by E. coli, K. pneumoniae or P. mirabilis: Cefazolin is susceptible if MIC <32 mcg/mL and predicts susceptible to the oral agents cefaclor, cefdinir, cefpodoxime, cefprozil, cefuroxime, cephalexin and loracarbef.     CEFTAZIDIME <=1 Sensitive     CEFEPIME <=1 Sensitive      CEFTRIAXONE <=1 Sensitive     CIPROFLOXACIN <=0.25 Sensitive     LEVOFLOXACIN <=0.12 Sensitive     GENTAMICIN <=1 Sensitive     IMIPENEM <=0.25 Sensitive     NITROFURANTOIN <=16 Sensitive     PIP/TAZO <=4 Sensitive     TOBRAMYCIN <=1 Sensitive     TRIMETH/SULFA* >=320 Resistant      * For infections other than uncomplicated UTI caused by E. coli, K. pneumoniae or P. mirabilis: Cefazolin is resistant if MIC > or = 8 mcg/mL. (Distinguishing susceptible versus intermediate for isolates with MIC < or = 4 mcg/mL requires additional testing.) For uncomplicated UTI caused by E. coli, K. pneumoniae or P. mirabilis: Cefazolin is susceptible if MIC <32 mcg/mL and predicts susceptible to the oral agents cefaclor, cefdinir, cefpodoxime, cefprozil, cefuroxime, cephalexin and loracarbef. Legend: S = Susceptible  I = Intermediate R = Resistant  NS = Not susceptible * = Not tested  NR = Not reported **NN = See antimicrobic comments   COVID-19, Flu  A+B and RSV     Status: None   Collection Time: 10/31/21 10:39 AM   Specimen: Nasopharyngeal(NP) swabs in vial transport medium   Nasopharynge  Resident  Result Value Ref Range Status   SARS-CoV-2, NAA Not Detected Not Detected Final   Influenza A, NAA Not Detected Not Detected Final   Influenza B, NAA Not Detected Not Detected Final   RSV, NAA Not Detected Not Detected Final   Test Information: Comment  Final    Comment: This nucleic acid amplification test was developed and its performance characteristics determined by [employer]. Nucleic acid amplification tests include RT-PCR and TMA. This test has not been FDA cleared or approved. This test has been authorized by FDA under an Emergency Use Authorization (EUA). This test is only authorized for the duration of time the declaration that circumstances exist justifying the authorization of the emergency use of in vitro diagnostic tests for detection of SARS-CoV-2 virus and/or  diagnosis of COVID-19 infection under section 564(b)(1) of the Act, 21 U.S.C. 188CZY-6(A) (1), unless the authorization is terminated or revoked sooner. When diagnostic testing is negative, the possibility of a false negative result should be considered in the context of a patient's recent exposures and the presence of clinical signs and symptoms consistent with COVID-19. An individual without symptoms of COVID-19 and who is not shedding SARS-CoV-2 virus wo uld expect to have a negative (not detected) result in this assay.   WET PREP BY MOLECULAR PROBE     Status: None   Collection Time: 10/31/21 11:10 AM   Specimen: Vaginal Fluid  Result Value Ref Range Status   MICRO NUMBER: [id number]  Final   SPECIMEN QUALITY: Adequate  Final   SOURCE: NOT GIVEN  Final   STATUS: FINAL  Final   Trichomonas vaginosis Not Detected  Final   Gardnerella vaginalis Not Detected  Final   Candida species Not Detected  Final  Blood culture (routine x 2)     Status: None (Preliminary result)   Collection Time: 11/02/21  9:54 PM   Specimen: BLOOD  Result Value Ref Range Status   Specimen Description BLOOD LEFT ASSIST CONTROL  Final   Special Requests   Final    BOTTLES DRAWN AEROBIC AND ANAEROBIC Blood Culture adequate volume   Culture   Final    NO GROWTH < 12 HOURS Performed at [facility], [address], [place], [place] [address]    Report Status PENDING  Incomplete  Resp Panel by RT-PCR (Flu A&B, Covid) Nasopharyngeal Swab     Status: None   Collection Time: 11/02/21  9:55 PM   Specimen: Nasopharyngeal Swab; Nasopharyngeal(NP) swabs in vial transport medium  Result Value Ref Range Status   SARS Coronavirus 2 by RT PCR NEGATIVE NEGATIVE Final    Comment: (NOTE) SARS-CoV-2 target nucleic acids are NOT DETECTED.  The SARS-CoV-2 RNA is generally detectable in upper respiratory specimens during the acute phase of infection. The lowest concentration of SARS-CoV-2 viral copies this assay  can detect is 138 copies/mL. A negative result does not preclude SARS-Cov-2 infection and should not be used as the sole basis for treatment or other patient management decisions. A negative result may occur with  improper specimen collection/handling, submission of specimen other than nasopharyngeal swab, presence of viral mutation(s) within the areas targeted by this assay, and inadequate number of viral copies(<138 copies/mL). A negative result must be combined with clinical observations, patient history, and epidemiological information. The expected result is Negative.  Fact Sheet  for Patients:  EntrepreneurPulse.com.au  Fact Sheet for Healthcare Providers:  IncredibleEmployment.be  This test is no t yet approved or cleared by the [place] FDA and  has been authorized for detection and/or diagnosis of SARS-CoV-2 by FDA under an Emergency Use Authorization (EUA). This EUA will remain  in effect (meaning this test can be used) for the duration of the COVID-19 declaration under Section 564(b)(1) of the Act, 21 U.S.C.section 360bbb-3(b)(1), unless the authorization is terminated  or revoked sooner.       Influenza A by PCR NEGATIVE NEGATIVE Final   Influenza B by PCR NEGATIVE NEGATIVE Final    Comment: (NOTE) The Xpert Xpress SARS-CoV-2/FLU/RSV plus assay is intended as an aid in the diagnosis of influenza from Nasopharyngeal swab specimens and should not be used as a sole basis for treatment. Nasal washings and aspirates are unacceptable for Xpert Xpress SARS-CoV-2/FLU/RSV testing.  Fact Sheet for Patients: EntrepreneurPulse.com.au  Fact Sheet for Healthcare Providers: IncredibleEmployment.be  This test is not yet approved or cleared by the [place] FDA and has been authorized for detection and/or diagnosis of SARS-CoV-2 by FDA under an Emergency Use Authorization (EUA). This EUA will  remain in effect (meaning this test can be used) for the duration of the COVID-19 declaration under Section 564(b)(1) of the Act, 21 U.S.C. section 360bbb-3(b)(1), unless the authorization is terminated or revoked.  Performed at [facility], [address] [place], [place] [address]          Radiology Studies: CT ABDOMEN PELVIS W CONTRAST  Result Date: 11/02/2021 CLINICAL DATA:  Left-sided flank pain EXAM: CT ABDOMEN AND PELVIS WITH CONTRAST TECHNIQUE: Multidetector CT imaging of the abdomen and pelvis was performed using the standard protocol following bolus administration of intravenous contrast. RADIATION DOSE REDUCTION: This exam was performed according to the departmental dose-optimization program which includes automated exposure control, adjustment of the mA and/or kV according to patient size and/or use of iterative reconstruction technique. CONTRAST:  119m OMNIPAQUE IOHEXOL 300 MG/ML  SOLN COMPARISON:  MRI 11/30/2015, CT 02/17/2011 FINDINGS: Lower chest: Lung bases demonstrate no acute consolidation or effusion. Normal cardiac size. Hepatobiliary: No focal liver abnormality is seen. No gallstones, gallbladder wall thickening, or biliary dilatation. Pancreas: Unremarkable. No pancreatic ductal dilatation or surrounding inflammatory changes. Spleen: Normal in size without focal abnormality. Adrenals/Urinary Tract: Adrenal glands are normal. Cyst midpole left kidney. Moderate perinephric fat stranding. Mild bilateral renal collecting system dilatation with urothelial enhancement. Striated nephrogram on delayed views, findings consistent with pyelonephritis. The bladder is normal Stomach/Bowel: Stomach is within normal limits. Appendix appears normal. No evidence of bowel wall thickening, distention, or inflammatory changes. Vascular/Lymphatic: Mild aortic atherosclerosis. No aneurysm. No suspicious lymph node Reproductive: Uterus and bilateral adnexa are unremarkable. Other:  Negative for pelvic effusion or free air. Musculoskeletal: No acute or significant osseous findings. IMPRESSION: 1. Findings consistent with acute pyelonephritis. Mild renal collecting system dilatation but no obstructing stones. Electronically Signed   By: KDonavan FoilM.D.   On: 11/02/2021 23:05        Scheduled Meds:  buPROPion  300 mg Oral Daily   enoxaparin (LOVENOX) injection  40 mg Subcutaneous Q24H   levothyroxine  75 mcg Oral Q0600   Continuous Infusions:  cefTRIAXone (ROCEPHIN)  IV     lactated ringers 150 mL/hr at 11/03/21 1122     LOS: 1 day    Time spent: 40 minutes    DIrine Seal MD [facility]   To contact the attending provider between 7A-7P  or the covering provider during after hours 7P-7A, please log into the web site [email or web address] and access using universal [place] password for that web site. If you do not have the password, please call the hospital operator.  11/03/2021, 3:42 PM

## 2021-11-03 NOTE — TOC Progression Note (Signed)
Transition of Care [facility]) - Progression Note    Patient Details  Name: Renee Mcdaniel MRN: [id number] Date of Birth: [birth year]  Transition of Care [facility]) CM/SW [place], RN Phone Number: 11/03/2021, 12:53 PM  Clinical Narrative:     Transition of Care [facility]) Screening Note   Patient Details  Name: Renee Mcdaniel Date of Birth: March 16, 1966   Transition of Care [facility]) CM/SW Contact:    Conception Oms, RN Phone Number: 11/03/2021, 12:54 PM    Transition of Care Department [facility]) has reviewed patient and no TOC needs have been identified at this time. We will continue to monitor patient advancement through interdisciplinary progression rounds. If new patient transition needs arise, please place a TOC consult.          Expected Discharge Plan and Services                                                 Social Determinants of Health (SDOH) Interventions    Readmission Risk Interventions No flowsheet data found.

## 2021-11-03 NOTE — Telephone Encounter (Signed)
Pt is presently at [facility]  ED and I spokc with Apolonio Schneiders at [facility] ED and pt is in ED now in rm 36 and is waiting admission to hospital as soon as room available. Sending note to Red Christians FNP.( I have already spoken with Tabitha as well.)

## 2021-11-03 NOTE — Progress Notes (Signed)
Patient is currently in the hospital and being admitted likely.

## 2021-11-03 NOTE — ED Notes (Signed)
Pt wondering if she can take her Lorrin Mais she has with her to help her sleep. Provider notified

## 2021-11-03 NOTE — Hospital Course (Signed)
Patient is 56 year old pleasant female history of breast cancer, hypothyroidism, migraines, hyperlipidemia presented with flank pain.  Patient noted to have recently been diagnosed with a UTI, placed on Bactrim and completed 1 week course however due to ongoing symptoms was seen by PCP 2 2 days prior to admission and patient placed on Cipro however due to ongoing symptoms of fevers, nausea and persistent left sided flank pain patient presented to the ED.  Work-up done including CT abdomen and pelvis concerning for acute pyelonephritis which had failed outpatient treatment.  Patient placed on empiric IV Rocephin and admitted for further evaluation and management.

## 2021-11-04 DIAGNOSIS — N390 Urinary tract infection, site not specified: Secondary | ICD-10-CM | POA: Diagnosis not present

## 2021-11-04 DIAGNOSIS — E782 Mixed hyperlipidemia: Secondary | ICD-10-CM | POA: Diagnosis not present

## 2021-11-04 DIAGNOSIS — N10 Acute tubulo-interstitial nephritis: Secondary | ICD-10-CM | POA: Diagnosis not present

## 2021-11-04 DIAGNOSIS — I10 Essential (primary) hypertension: Secondary | ICD-10-CM | POA: Diagnosis not present

## 2021-11-04 LAB — CBC WITH DIFFERENTIAL/PLATELET
Abs Immature Granulocytes: 0.03 10*3/uL (ref 0.00–0.07)
Basophils Absolute: 0 10*3/uL (ref 0.0–0.1)
Basophils Relative: 1 %
Eosinophils Absolute: 0 10*3/uL (ref 0.0–0.5)
Eosinophils Relative: 1 %
HCT: 36.6 % (ref 36.0–46.0)
Hemoglobin: 11.6 g/dL — ABNORMAL LOW (ref 12.0–15.0)
Immature Granulocytes: 0 %
Lymphocytes Relative: 19 %
Lymphs Abs: 1.5 10*3/uL (ref 0.7–4.0)
MCH: 28.2 pg (ref 26.0–34.0)
MCHC: 31.7 g/dL (ref 30.0–36.0)
MCV: 88.8 fL (ref 80.0–100.0)
Monocytes Absolute: 1 10*3/uL (ref 0.1–1.0)
Monocytes Relative: 12 %
Neutro Abs: 5.4 10*3/uL (ref 1.7–7.7)
Neutrophils Relative %: 67 %
Platelets: 223 10*3/uL (ref 150–400)
RBC: 4.12 MIL/uL (ref 3.87–5.11)
RDW: 13.2 % (ref 11.5–15.5)
WBC: 8 10*3/uL (ref 4.0–10.5)
nRBC: 0 % (ref 0.0–0.2)

## 2021-11-04 LAB — BASIC METABOLIC PANEL
Anion gap: 7 (ref 5–15)
BUN: 9 mg/dL (ref 6–20)
CO2: 27 mmol/L (ref 22–32)
Calcium: 8.9 mg/dL (ref 8.9–10.3)
Chloride: 105 mmol/L (ref 98–111)
Creatinine, Ser: 1.09 mg/dL — ABNORMAL HIGH (ref 0.44–1.00)
GFR, Estimated: 60 mL/min — ABNORMAL LOW (ref >60)
Glucose, Bld: 93 mg/dL (ref 70–99)
Potassium: 4.1 mmol/L (ref 3.5–5.1)
Sodium: 139 mmol/L (ref 135–145)

## 2021-11-04 LAB — MAGNESIUM: Magnesium: 1.8 mg/dL (ref 1.7–2.4)

## 2021-11-04 LAB — URINE CULTURE: Culture: NO GROWTH

## 2021-11-04 MED ORDER — KETOROLAC TROMETHAMINE 30 MG/ML IJ SOLN
30.0000 mg | Freq: Four times a day (QID) | INTRAMUSCULAR | Status: DC | PRN
Start: 2021-11-04 — End: 2021-11-05

## 2021-11-04 MED ORDER — TRAMADOL HCL 50 MG PO TABS: 100.0000 mg | ORAL_TABLET | Freq: Four times a day (QID) | ORAL | Status: DC | PRN

## 2021-11-04 MED ORDER — DIPHENHYDRAMINE HCL 25 MG PO CAPS
25.0000 mg | ORAL_CAPSULE | Freq: Every evening | ORAL | Status: DC | PRN
  Administered 2021-11-04: 25 mg via ORAL
  Filled 2021-11-04: qty 1

## 2021-11-04 MED ORDER — CIPROFLOXACIN HCL 500 MG PO TABS
500.0000 mg | ORAL_TABLET | Freq: Two times a day (BID) | ORAL | Status: DC
  Administered 2021-11-05: 500 mg via ORAL
  Filled 2021-11-04: qty 1

## 2021-11-04 MED ORDER — MAGNESIUM SULFATE 2 GM/50ML IV SOLN
2.0000 g | Freq: Once | INTRAVENOUS | Status: AC
  Administered 2021-11-04: 2 g via INTRAVENOUS
  Filled 2021-11-04: qty 50

## 2021-11-04 NOTE — Progress Notes (Signed)
PROGRESS NOTE    Renee Mcdaniel  TOI:[id number] DOB: March 11, 1966 DOA: 11/02/2021 PCP: Lesleigh Noe, MD    Chief Complaint  Patient presents with   Flank Pain    Brief Narrative:  Patient is 56 year old pleasant female history of breast cancer, hypothyroidism, migraines, hyperlipidemia presented with flank pain.  Patient noted to have recently been diagnosed with a UTI, placed on Bactrim and completed 1 week course however due to ongoing symptoms was seen by PCP 2 2 days prior to admission and patient placed on Cipro however due to ongoing symptoms of fevers, nausea and persistent left sided flank pain patient presented to the ED.  Work-up done including CT abdomen and pelvis concerning for acute pyelonephritis which had failed outpatient treatment.  Patient placed on empiric IV Rocephin and admitted for further evaluation and management.    Assessment & Plan:   Assessment and Plan: * Acute pyelonephritis -Patient failed Bactrim and ?? Cipro outpatient settings. -Patient presented with ongoing dysuria, left-sided flank pain noted to have a temp of 100.2 in the ED, some tachycardia.  Patient also noted with a leukocytosis. -Urinalysis with large leukocytes, nitrite negative, WBC > 50. -Urine cultures pending -CT abdomen and pelvis done concerning for acute pyelonephritis.  -Urine cultures from 10/31/2021 with > 100,000 colonies of E. coli, resistant to Bactrim, ampicillin and sensitive to the cephalosporins, ciprofloxacin, Macrobid. -Patient currently on IV Rocephin, improving clinically. -Continue IV Rocephin through today and transition to oral ciprofloxacin tomorrow. -IV antiemetics, supportive care.  Urinary tract infection without hematuria - See acute pyelonephritis  HTN (hypertension) - Blood pressure borderline on admission however improved with hydration.. -Patient states was on Atacand HCTZ for migraines.   -Outpatient follow-up.  Sepsis ([place]) - On admission patient  met criteria for sepsis with leukocytosis, tachycardia, acute pyelonephritis.   -Patient improving clinically.   -Continue IV antibiotics, IV antiemetics, supportive care.   -See acute pyelonephritis as outlined above.  Hypothyroidism- (present on admission) - Synthroid.   Hyperlipidemia- (present on admission) - Continue to hold statin and resume on discharge.    Breast cancer of upper-outer quadrant of right female breast [facility])- (present on admission) No acute issues Outpatient follow-up.         DVT prophylaxis: Lovenox Code Status: Full Family Communication: Updated patient.  Updated sister at bedside. Disposition: Home when clinically improved.  Status is: Inpatient Remains inpatient appropriate because: Severity of illness           Consultants:  None  Procedures:  CT abdomen and pelvis 11/02/2021   Antimicrobials:  IV Rocephin 11/02/2021>>>>>> 11/04/2021 Oral ciprofloxacin 11/05/2021>>>>   Subjective: Sitting up in bed.  Overall feeling better.  Still with some suprapubic abdominal pain.  States when she lays flat has some bilateral CVA tenderness.  No nausea or emesis.  Oral intake poor but slowly improving.  Overall feeling better than she did on admission.  Sister at bedside.   Objective: Vitals:   11/03/21 2029 11/04/21 0404 11/04/21 0725 11/04/21 1625  BP: 120/87 112/70 129/83 121/80  Pulse: 87 84 76 82  Resp: '16 16 16 16  ' Temp: 98.5 F (36.9 C) 98.5 F (36.9 C) 98.6 F (37 C) 97.8 F (36.6 C)  TempSrc:      SpO2: 100% 97% 96% 99%  Weight:      Height:        Intake/Output Summary (Last 24 hours) at 11/04/2021 1712 Last data filed at 11/03/2021 1900 Gross per 24 hour  Intake 0 ml  Output --  Net 0 ml   Filed Weights   11/02/21 2137  Weight: 81.6 kg    Examination:  General exam: NAD Respiratory system: Lungs clear to auscultation bilaterally.  No wheezes, no crackles, no rhonchi.  Normal respiratory effort.  Speaking in full  sentences  Cardiovascular system: Regular rate rhythm no murmurs rubs or gallops.  No JVD.  No lower extremity edema.  Gastrointestinal system: Abdomen is soft, nondistended, some tenderness to palpation in the suprapubic region.  No CVA tenderness to palpation.  Central nervous system: Alert and oriented. No focal neurological deficits. Extremities: Symmetric 5 x 5 power. Skin: No rashes, lesions or ulcers Psychiatry: Judgement and insight appear normal. Mood & affect appropriate.     Data Reviewed:   CBC: Recent Labs  Lab 11/02/21 2154 11/03/21 0401 11/04/21 0649  WBC 12.6* 10.7* 8.0  NEUTROABS  --   --  5.4  HGB 12.9 12.1 11.6*  HCT 39.2 37.4 36.6  MCV 87.3 89.7 88.8  PLT 217 201 748    Basic Metabolic Panel: Recent Labs  Lab 11/02/21 2154 11/03/21 0401 11/04/21 0649  NA 133* 136 139  K 4.4 4.4 4.1  CL 98 102 105  CO2 '25 28 27  ' GLUCOSE 122* 110* 93  BUN '14 12 9  ' CREATININE 1.25* 1.20* 1.09*  CALCIUM 9.4 8.8* 8.9  MG  --   --  1.8    GFR: Estimated Creatinine Clearance: 57.7 mL/min (A) (by C-G formula based on SCr of 1.09 mg/dL (H)).  Liver Function Tests: Recent Labs  Lab 11/02/21 2155  AST 14*  ALT 15  ALKPHOS 68  BILITOT 0.9  PROT 7.0  ALBUMIN 3.6    CBG: No results for input(s): GLUCAP in the last 168 hours.   Recent Results (from the past 240 hour(s))  Urine Culture     Status: Abnormal   Collection Time: 10/31/21 10:39 AM   Specimen: Urine  Result Value Ref Range Status   MICRO NUMBER: [id number]  Final   SPECIMEN QUALITY: Adequate  Final   Sample Source URINE  Final   STATUS: FINAL  Final   ISOLATE 1: Escherichia coli (A)  Final    Comment: Greater than 100,000 CFU/mL of Escherichia coli      Susceptibility   Escherichia coli - URINE CULTURE, REFLEX    AMOX/CLAVULANIC 8 Sensitive     AMPICILLIN >=32 Resistant     AMPICILLIN/SULBACTAM 16 Intermediate     CEFAZOLIN* <=4 Not Reportable      * For infections other than uncomplicated  UTI caused by E. coli, K. pneumoniae or P. mirabilis: Cefazolin is resistant if MIC > or = 8 mcg/mL. (Distinguishing susceptible versus intermediate for isolates with MIC < or = 4 mcg/mL requires additional testing.) For uncomplicated UTI caused by E. coli, K. pneumoniae or P. mirabilis: Cefazolin is susceptible if MIC <32 mcg/mL and predicts susceptible to the oral agents cefaclor, cefdinir, cefpodoxime, cefprozil, cefuroxime, cephalexin and loracarbef.     CEFTAZIDIME <=1 Sensitive     CEFEPIME <=1 Sensitive     CEFTRIAXONE <=1 Sensitive     CIPROFLOXACIN <=0.25 Sensitive     LEVOFLOXACIN <=0.12 Sensitive     GENTAMICIN <=1 Sensitive     IMIPENEM <=0.25 Sensitive     NITROFURANTOIN <=16 Sensitive     PIP/TAZO <=4 Sensitive     TOBRAMYCIN <=1 Sensitive     TRIMETH/SULFA* >=320 Resistant      * For infections other than uncomplicated UTI caused by  E. coli, K. pneumoniae or P. mirabilis: Cefazolin is resistant if MIC > or = 8 mcg/mL. (Distinguishing susceptible versus intermediate for isolates with MIC < or = 4 mcg/mL requires additional testing.) For uncomplicated UTI caused by E. coli, K. pneumoniae or P. mirabilis: Cefazolin is susceptible if MIC <32 mcg/mL and predicts susceptible to the oral agents cefaclor, cefdinir, cefpodoxime, cefprozil, cefuroxime, cephalexin and loracarbef. Legend: S = Susceptible  I = Intermediate R = Resistant  NS = Not susceptible * = Not tested  NR = Not reported **NN = See antimicrobic comments   COVID-19, Flu A+B and RSV     Status: None   Collection Time: 10/31/21 10:39 AM   Specimen: Nasopharyngeal(NP) swabs in vial transport medium   Nasopharynge  Resident  Result Value Ref Range Status   SARS-CoV-2, NAA Not Detected Not Detected Final   Influenza A, NAA Not Detected Not Detected Final   Influenza B, NAA Not Detected Not Detected Final   RSV, NAA Not Detected Not Detected Final   Test Information: Comment  Final    Comment: This  nucleic acid amplification test was developed and its performance characteristics determined by [employer]. Nucleic acid amplification tests include RT-PCR and TMA. This test has not been FDA cleared or approved. This test has been authorized by FDA under an Emergency Use Authorization (EUA). This test is only authorized for the duration of time the declaration that circumstances exist justifying the authorization of the emergency use of in vitro diagnostic tests for detection of SARS-CoV-2 virus and/or diagnosis of COVID-19 infection under section 564(b)(1) of the Act, 21 U.S.C. 025KYH-0(W) (1), unless the authorization is terminated or revoked sooner. When diagnostic testing is negative, the possibility of a false negative result should be considered in the context of a patient's recent exposures and the presence of clinical signs and symptoms consistent with COVID-19. An individual without symptoms of COVID-19 and who is not shedding SARS-CoV-2 virus wo uld expect to have a negative (not detected) result in this assay.   WET PREP BY MOLECULAR PROBE     Status: None   Collection Time: 10/31/21 11:10 AM   Specimen: Vaginal Fluid  Result Value Ref Range Status   MICRO NUMBER: [id number]  Final   SPECIMEN QUALITY: Adequate  Final   SOURCE: NOT GIVEN  Final   STATUS: FINAL  Final   Trichomonas vaginosis Not Detected  Final   Gardnerella vaginalis Not Detected  Final   Candida species Not Detected  Final  Blood culture (routine x 2)     Status: None (Preliminary result)   Collection Time: 11/02/21  9:54 PM   Specimen: BLOOD  Result Value Ref Range Status   Specimen Description BLOOD LEFT ASSIST CONTROL  Final   Special Requests   Final    BOTTLES DRAWN AEROBIC AND ANAEROBIC Blood Culture adequate volume   Culture   Final    NO GROWTH 2 DAYS Performed at [facility], [address], [place], [place] [address]    Report Status PENDING  Incomplete  Urine Culture      Status: None   Collection Time: 11/02/21  9:55 PM   Specimen: Urine, Random  Result Value Ref Range Status   Specimen Description   Final    URINE, RANDOM Performed at [facility], [address], [place], [place] [address]    Special Requests   Final    NONE Performed at [facility], [address], [place], [place] [address]  Culture   Final    NO GROWTH Performed at [facility], [place] [address], [place], [place] [address]    Report Status 11/04/2021 FINAL  Final  Resp Panel by RT-PCR (Flu A&B, Covid) Nasopharyngeal Swab     Status: None   Collection Time: 11/02/21  9:55 PM   Specimen: Nasopharyngeal Swab; Nasopharyngeal(NP) swabs in vial transport medium  Result Value Ref Range Status   SARS Coronavirus 2 by RT PCR NEGATIVE NEGATIVE Final    Comment: (NOTE) SARS-CoV-2 target nucleic acids are NOT DETECTED.  The SARS-CoV-2 RNA is generally detectable in upper respiratory specimens during the acute phase of infection. The lowest concentration of SARS-CoV-2 viral copies this assay can detect is 138 copies/mL. A negative result does not preclude SARS-Cov-2 infection and should not be used as the sole basis for treatment or other patient management decisions. A negative result may occur with  improper specimen collection/handling, submission of specimen other than nasopharyngeal swab, presence of viral mutation(s) within the areas targeted by this assay, and inadequate number of viral copies(<138 copies/mL). A negative result must be combined with clinical observations, patient history, and epidemiological information. The expected result is Negative.  Fact Sheet for Patients:  EntrepreneurPulse.com.au  Fact Sheet for Healthcare Providers:  IncredibleEmployment.be  This test is no t yet approved or cleared by the [place] FDA and  has been authorized for detection and/or diagnosis of SARS-CoV-2 by FDA  under an Emergency Use Authorization (EUA). This EUA will remain  in effect (meaning this test can be used) for the duration of the COVID-19 declaration under Section 564(b)(1) of the Act, 21 U.S.C.section 360bbb-3(b)(1), unless the authorization is terminated  or revoked sooner.       Influenza A by PCR NEGATIVE NEGATIVE Final   Influenza B by PCR NEGATIVE NEGATIVE Final    Comment: (NOTE) The Xpert Xpress SARS-CoV-2/FLU/RSV plus assay is intended as an aid in the diagnosis of influenza from Nasopharyngeal swab specimens and should not be used as a sole basis for treatment. Nasal washings and aspirates are unacceptable for Xpert Xpress SARS-CoV-2/FLU/RSV testing.  Fact Sheet for Patients: EntrepreneurPulse.com.au  Fact Sheet for Healthcare Providers: IncredibleEmployment.be  This test is not yet approved or cleared by the [place] FDA and has been authorized for detection and/or diagnosis of SARS-CoV-2 by FDA under an Emergency Use Authorization (EUA). This EUA will remain in effect (meaning this test can be used) for the duration of the COVID-19 declaration under Section 564(b)(1) of the Act, 21 U.S.C. section 360bbb-3(b)(1), unless the authorization is terminated or revoked.  Performed at [facility], [address] [place], [place] [address]   Culture, blood (Routine X 2) w Reflex to ID Panel     Status: None (Preliminary result)   Collection Time: 11/03/21 11:46 AM   Specimen: BLOOD  Result Value Ref Range Status   Specimen Description BLOOD RIGHT [facility]  Final   Special Requests   Final    BOTTLES DRAWN AEROBIC AND ANAEROBIC Blood Culture adequate volume   Culture   Final    NO GROWTH < 24 HOURS Performed at [facility], [address] [place], [place] [address]    Report Status PENDING  Incomplete         Radiology Studies: CT ABDOMEN PELVIS W CONTRAST  Result Date: 11/02/2021 CLINICAL DATA:   Left-sided flank pain EXAM: CT ABDOMEN AND PELVIS WITH CONTRAST TECHNIQUE: Multidetector CT imaging of the abdomen and pelvis was performed using the  standard protocol following bolus administration of intravenous contrast. RADIATION DOSE REDUCTION: This exam was performed according to the departmental dose-optimization program which includes automated exposure control, adjustment of the mA and/or kV according to patient size and/or use of iterative reconstruction technique. CONTRAST:  18m OMNIPAQUE IOHEXOL 300 MG/ML  SOLN COMPARISON:  MRI 11/30/2015, CT 02/17/2011 FINDINGS: Lower chest: Lung bases demonstrate no acute consolidation or effusion. Normal cardiac size. Hepatobiliary: No focal liver abnormality is seen. No gallstones, gallbladder wall thickening, or biliary dilatation. Pancreas: Unremarkable. No pancreatic ductal dilatation or surrounding inflammatory changes. Spleen: Normal in size without focal abnormality. Adrenals/Urinary Tract: Adrenal glands are normal. Cyst midpole left kidney. Moderate perinephric fat stranding. Mild bilateral renal collecting system dilatation with urothelial enhancement. Striated nephrogram on delayed views, findings consistent with pyelonephritis. The bladder is normal Stomach/Bowel: Stomach is within normal limits. Appendix appears normal. No evidence of bowel wall thickening, distention, or inflammatory changes. Vascular/Lymphatic: Mild aortic atherosclerosis. No aneurysm. No suspicious lymph node Reproductive: Uterus and bilateral adnexa are unremarkable. Other: Negative for pelvic effusion or free air. Musculoskeletal: No acute or significant osseous findings. IMPRESSION: 1. Findings consistent with acute pyelonephritis. Mild renal collecting system dilatation but no obstructing stones. Electronically Signed   By: KDonavan FoilM.D.   On: 11/02/2021 23:05        Scheduled Meds:  buPROPion  300 mg Oral Daily   [START ON 11/05/2021] ciprofloxacin  500 mg Oral BID    levothyroxine  75 mcg Oral Q0600   Continuous Infusions:     LOS: 2 days    Time spent: 35 minutes    DIrine Seal MD [facility]   To contact the attending provider between 7A-7P or the covering provider during after hours 7P-7A, please log into the web site [email or web address] and access using universal [place] password for that web site. If you do not have the password, please call the hospital operator.  11/04/2021, 5:12 PM

## 2021-11-04 NOTE — Assessment & Plan Note (Signed)
-   See acute pyelonephritis

## 2021-11-05 LAB — CBC
HCT: 35.2 % — ABNORMAL LOW (ref 36.0–46.0)
Hemoglobin: 11.2 g/dL — ABNORMAL LOW (ref 12.0–15.0)
MCH: 28.6 pg (ref 26.0–34.0)
MCHC: 31.8 g/dL (ref 30.0–36.0)
MCV: 89.8 fL (ref 80.0–100.0)
Platelets: 234 K/uL (ref 150–400)
RBC: 3.92 MIL/uL (ref 3.87–5.11)
RDW: 13.2 % (ref 11.5–15.5)
WBC: 7 K/uL (ref 4.0–10.5)
nRBC: 0 % (ref 0.0–0.2)

## 2021-11-05 LAB — BASIC METABOLIC PANEL
Anion gap: 10 (ref 5–15)
BUN: 9 mg/dL (ref 6–20)
CO2: 28 mmol/L (ref 22–32)
Calcium: 9 mg/dL (ref 8.9–10.3)
Chloride: 103 mmol/L (ref 98–111)
Creatinine, Ser: 1.01 mg/dL — ABNORMAL HIGH (ref 0.44–1.00)
GFR, Estimated: >60 mL/min (ref >60)
Glucose, Bld: 88 mg/dL (ref 70–99)
Potassium: 3.6 mmol/L (ref 3.5–5.1)
Sodium: 141 mmol/L (ref 135–145)

## 2021-11-05 LAB — MAGNESIUM: Magnesium: 2 mg/dL (ref 1.7–2.4)

## 2021-11-05 MED ORDER — ATORVASTATIN CALCIUM 10 MG PO TABS: 10.0000 mg | ORAL_TABLET | Freq: Every day | ORAL | Status: AC

## 2021-11-05 MED ORDER — CIPROFLOXACIN HCL 500 MG PO TABS
500.0000 mg | ORAL_TABLET | Freq: Two times a day (BID) | ORAL | 0 refills | Status: AC
Start: 2021-11-05 — End: 2021-11-12

## 2021-11-05 MED ORDER — ACETAMINOPHEN 325 MG PO TABS: 650.0000 mg | ORAL_TABLET | Freq: Four times a day (QID) | ORAL | Status: AC | PRN

## 2021-11-05 NOTE — Discharge Summary (Signed)
Physician Discharge Summary  Renee July ZHG:[id number] DOB: [birth year] DOA: 11/02/2021  PCP: Lesleigh Noe, MD  Admit date: 11/02/2021 Discharge date: 11/05/2021  Time spent: 55 minutes  Recommendations for Outpatient Follow-up:  Follow-up with Lesleigh Noe, MD in 2 weeks.  On follow-up patient will need a basic metabolic profile done to follow-up on electrolytes and renal function.  Patient will need a CBC done to follow-up on H&H.    Discharge Diagnoses:  Principal Problem:   Acute pyelonephritis Active Problems:   Breast cancer of upper-outer quadrant of right female breast ([place])   Hyperlipidemia   Hypothyroidism   Sepsis ([place])   HTN (hypertension)   Urinary tract infection without hematuria   Discharge Condition: Stable and improved  Diet recommendation: Heart healthy  Filed Weights   11/02/21 2137  Weight: 81.6 kg    History of present illness:  HPI per Dr. Damita Dunnings : Renee Mcdaniel is a 56 y.o. female with medical history significant of Breast cancer, hypothyroidism, migraines and HLD who presents to the ED for evaluation of flank pain.  Patient recently saw her PCP via televisit and was started on Bactrim a week ago but symptoms persisted.  On follow-up 2 days ago, she was switched to Cipro.  She continues to feel unwell and started having fevers, nausea and persistent left-sided flank pain prompting the visit to the ED.   ED course: On arrival temperature 100.2, pulse 103 with BP 111/80 Blood work with WBC 12,000, lactic acid 1.3 Creatinine 1.25, up from baseline of 0.94 Lipase and LFTs WNL CT abdomen and pelvis consistent with acute pyelonephritis   Patient started on Rocephin and sepsis fluids.  Hospitalist consulted for admission.    Review of Systems: As mentioned in the history of present illness. All other systems reviewed and are negative. Hospital Course:   Assessment and Plan: * Acute pyelonephritis -Patient failed Bactrim and ?? Cipro  outpatient settings. -Patient presented with ongoing dysuria, left-sided flank pain noted to have a temp of 100.2 in the ED, some tachycardia.  Patient also noted with a leukocytosis. -Urinalysis with large leukocytes, nitrite negative, WBC > 50. -Urine cultures pending -CT abdomen and pelvis done concerning for acute pyelonephritis.  -Urine cultures from 10/31/2021 with > 100,000 colonies of E. coli, resistant to Bactrim, ampicillin and sensitive to the cephalosporins, ciprofloxacin, Macrobid. -Patient was placed empirically on IV Rocephin on admission, improved clinically and subsequently transition to oral ciprofloxacin which she tolerated. -Urine cultures obtained on presentation to the ED were negative however patient had been on oral antibiotics prior to admission.  -Patient also maintained on IV fluids, IV antiemetics and supportive care.  -Patient improved clinically and will be discharged home in stable and improved condition on 7 more days of oral ciprofloxacin to complete a 10-day course of antibiotic treatment.  -Outpatient follow-up with PCP.   Urinary tract infection without hematuria - See acute pyelonephritis  HTN (hypertension) - Blood pressure borderline on admission however improved with hydration.. -Patient stated was on Atacand HCTZ for migraines per PCP which has subsequently been discontinued. -Blood pressure remained stable throughout the hospitalization. -Outpatient follow-up.  Sepsis ([place]) - On admission patient met criteria for sepsis with leukocytosis, tachycardia, acute pyelonephritis.   -Patient pancultured with no growth to date. -Patient remained afebrile and improved clinically during the hospitalization with resolution of leukocytosis. -Patient placed empirically on IV antibiotics, IV antiemetics, IV fluids and supportive care. -As patient improved clinically was transitioned to oral antibiotics which she will  be discharged home on. -See acute  pyelonephritis above.  Hypothyroidism- (present on admission) - Patient maintained on home regimen Synthroid.  Hyperlipidemia- (present on admission) - Patient statin was held during the hospitalization and will be resumed 3 days post discharge.  Breast cancer of upper-outer quadrant of right female breast [facility])- (present on admission) No acute issues Outpatient follow-up.        Procedures: CT abdomen and pelvis 11/02/2021  Consultations: None  Discharge Exam: Vitals:   11/05/21 0414 11/05/21 0725  BP: 116/75 112/71  Pulse: 84 76  Resp: 16 16  Temp: 98.8 F (37.1 C) 98.8 F (37.1 C)  SpO2: 97% 97%    General: NAD. Cardiovascular: RRR no murmurs rubs or gallops.  No JVD.  No lower extremity edema. Respiratory: Clear to auscultation bilaterally.  No wheezes, no crackles, no rhonchi  Discharge Instructions   Discharge Instructions     Diet - low sodium heart healthy   Complete by: As directed    Increase activity slowly   Complete by: As directed       Allergies as of 11/05/2021       Reactions   Amoxicillin-pot Clavulanate Nausea And Vomiting   Oxycodone Nausea Only        Medication List     STOP taking these medications    candesartan-hydrochlorothiazide 16-12.5 MG tablet Commonly known as: ATACAND HCT   phenazopyridine 100 MG tablet Commonly known as: PYRIDIUM   sulfamethoxazole-trimethoprim 800-160 MG tablet Commonly known as: BACTRIM DS       TAKE these medications    acetaminophen 325 MG tablet Commonly known as: TYLENOL Take 2 tablets (650 mg total) by mouth every 6 (six) hours as needed for mild pain (or Fever >/= 101).   ALPRAZolam 0.5 MG tablet Commonly known as: XANAX Take 0.5 mg by mouth as needed.   atorvastatin 10 MG tablet Commonly known as: LIPITOR Take 1 tablet (10 mg total) by mouth daily. Start taking on: November 07, 2021 What changed: These instructions start on November 07, 2021. If you are unsure what to do  until then, ask your doctor or other care provider.   Botox 200 units Solr Generic drug: Botulinum Toxin Type A As needed for migraines   buPROPion 300 MG 24 hr tablet Commonly known as: WELLBUTRIN XL Take 1 tablet by mouth daily.   ciprofloxacin 500 MG tablet Commonly known as: CIPRO Take 1 tablet (500 mg total) by mouth 2 (two) times daily for 7 days. What changed: when to take this   Diclofenac Potassium(Migraine) 50 MG Pack Take 1 each by mouth as needed.   fluconazole 150 MG tablet Commonly known as: DIFLUCAN Take one po qd for one dose, then repeat in three days if no improvement in symptoms   levothyroxine 75 MCG tablet Commonly known as: SYNTHROID Take 75 mcg by mouth daily before breakfast.   ondansetron 8 MG tablet Commonly known as: ZOFRAN Take 8 mg by mouth 3 (three) times daily as needed.   WOMENS MULTI VITAMIN & MINERAL PO Take 1 tablet by mouth daily.   zolpidem 10 MG tablet Commonly known as: AMBIEN Take 1 tablet by mouth at bedtime as needed.       Allergies  Allergen Reactions   Amoxicillin-Pot Clavulanate Nausea And Vomiting   Oxycodone Nausea Only    Follow-up Information     Lesleigh Noe, MD. Schedule an appointment as soon as possible for a visit in 2 week(s).   Specialty: Family Medicine  Contact information: [place] [address] [phone]                  The results of significant diagnostics from this hospitalization (including imaging, microbiology, ancillary and laboratory) are listed below for reference.    Significant Diagnostic Studies: CT ABDOMEN PELVIS W CONTRAST  Result Date: 11/02/2021 CLINICAL DATA:  Left-sided flank pain EXAM: CT ABDOMEN AND PELVIS WITH CONTRAST TECHNIQUE: Multidetector CT imaging of the abdomen and pelvis was performed using the standard protocol following bolus administration of intravenous contrast. RADIATION DOSE REDUCTION: This exam was performed according to the  departmental dose-optimization program which includes automated exposure control, adjustment of the mA and/or kV according to patient size and/or use of iterative reconstruction technique. CONTRAST:  19m OMNIPAQUE IOHEXOL 300 MG/ML  SOLN COMPARISON:  MRI 11/30/2015, CT 02/17/2011 FINDINGS: Lower chest: Lung bases demonstrate no acute consolidation or effusion. Normal cardiac size. Hepatobiliary: No focal liver abnormality is seen. No gallstones, gallbladder wall thickening, or biliary dilatation. Pancreas: Unremarkable. No pancreatic ductal dilatation or surrounding inflammatory changes. Spleen: Normal in size without focal abnormality. Adrenals/Urinary Tract: Adrenal glands are normal. Cyst midpole left kidney. Moderate perinephric fat stranding. Mild bilateral renal collecting system dilatation with urothelial enhancement. Striated nephrogram on delayed views, findings consistent with pyelonephritis. The bladder is normal Stomach/Bowel: Stomach is within normal limits. Appendix appears normal. No evidence of bowel wall thickening, distention, or inflammatory changes. Vascular/Lymphatic: Mild aortic atherosclerosis. No aneurysm. No suspicious lymph node Reproductive: Uterus and bilateral adnexa are unremarkable. Other: Negative for pelvic effusion or free air. Musculoskeletal: No acute or significant osseous findings. IMPRESSION: 1. Findings consistent with acute pyelonephritis. Mild renal collecting system dilatation but no obstructing stones. Electronically Signed   By: KDonavan FoilM.D.   On: 11/02/2021 23:05    Microbiology: Recent Results (from the past 240 hour(s))  Urine Culture     Status: Abnormal   Collection Time: 10/31/21 10:39 AM   Specimen: Urine  Result Value Ref Range Status   MICRO NUMBER: [id number] Final   SPECIMEN QUALITY: Adequate  Final   Sample Source URINE  Final   STATUS: FINAL  Final   ISOLATE 1: Escherichia coli (A)  Final    Comment: Greater than 100,000 CFU/mL of  Escherichia coli      Susceptibility   Escherichia coli - URINE CULTURE, REFLEX    AMOX/CLAVULANIC 8 Sensitive     AMPICILLIN >=32 Resistant     AMPICILLIN/SULBACTAM 16 Intermediate     CEFAZOLIN* <=4 Not Reportable      * For infections other than uncomplicated UTI caused by E. coli, K. pneumoniae or P. mirabilis: Cefazolin is resistant if MIC > or = 8 mcg/mL. (Distinguishing susceptible versus intermediate for isolates with MIC < or = 4 mcg/mL requires additional testing.) For uncomplicated UTI caused by E. coli, K. pneumoniae or P. mirabilis: Cefazolin is susceptible if MIC <32 mcg/mL and predicts susceptible to the oral agents cefaclor, cefdinir, cefpodoxime, cefprozil, cefuroxime, cephalexin and loracarbef.     CEFTAZIDIME <=1 Sensitive     CEFEPIME <=1 Sensitive     CEFTRIAXONE <=1 Sensitive     CIPROFLOXACIN <=0.25 Sensitive     LEVOFLOXACIN <=0.12 Sensitive     GENTAMICIN <=1 Sensitive     IMIPENEM <=0.25 Sensitive     NITROFURANTOIN <=16 Sensitive     PIP/TAZO <=4 Sensitive     TOBRAMYCIN <=1 Sensitive     TRIMETH/SULFA* >=320 Resistant      *  For infections other than uncomplicated UTI caused by E. coli, K. pneumoniae or P. mirabilis: Cefazolin is resistant if MIC > or = 8 mcg/mL. (Distinguishing susceptible versus intermediate for isolates with MIC < or = 4 mcg/mL requires additional testing.) For uncomplicated UTI caused by E. coli, K. pneumoniae or P. mirabilis: Cefazolin is susceptible if MIC <32 mcg/mL and predicts susceptible to the oral agents cefaclor, cefdinir, cefpodoxime, cefprozil, cefuroxime, cephalexin and loracarbef. Legend: S = Susceptible  I = Intermediate R = Resistant  NS = Not susceptible * = Not tested  NR = Not reported **NN = See antimicrobic comments   COVID-19, Flu A+B and RSV     Status: None   Collection Time: 10/31/21 10:39 AM   Specimen: Nasopharyngeal(NP) swabs in vial transport medium   Nasopharynge  Resident  Result Value  Ref Range Status   SARS-CoV-2, NAA Not Detected Not Detected Final   Influenza A, NAA Not Detected Not Detected Final   Influenza B, NAA Not Detected Not Detected Final   RSV, NAA Not Detected Not Detected Final   Test Information: Comment  Final    Comment: This nucleic acid amplification test was developed and its performance characteristics determined by [employer]. Nucleic acid amplification tests include RT-PCR and TMA. This test has not been FDA cleared or approved. This test has been authorized by FDA under an Emergency Use Authorization (EUA). This test is only authorized for the duration of time the declaration that circumstances exist justifying the authorization of the emergency use of in vitro diagnostic tests for detection of SARS-CoV-2 virus and/or diagnosis of COVID-19 infection under section 564(b)(1) of the Act, 21 U.S.C. 948NIO-2(V) (1), unless the authorization is terminated or revoked sooner. When diagnostic testing is negative, the possibility of a false negative result should be considered in the context of a patient's recent exposures and the presence of clinical signs and symptoms consistent with COVID-19. An individual without symptoms of COVID-19 and who is not shedding SARS-CoV-2 virus wo uld expect to have a negative (not detected) result in this assay.   WET PREP BY MOLECULAR PROBE     Status: None   Collection Time: 10/31/21 11:10 AM   Specimen: Vaginal Fluid  Result Value Ref Range Status   MICRO NUMBER: [id number]  Final   SPECIMEN QUALITY: Adequate  Final   SOURCE: NOT GIVEN  Final   STATUS: FINAL  Final   Trichomonas vaginosis Not Detected  Final   Gardnerella vaginalis Not Detected  Final   Candida species Not Detected  Final  Blood culture (routine x 2)     Status: None (Preliminary result)   Collection Time: 11/02/21  9:54 PM   Specimen: BLOOD  Result Value Ref Range Status   Specimen Description BLOOD LEFT ASSIST CONTROL  Final    Special Requests   Final    BOTTLES DRAWN AEROBIC AND ANAEROBIC Blood Culture adequate volume   Culture   Final    NO GROWTH 3 DAYS Performed at [facility], [address], [place], [place] [address]    Report Status PENDING  Incomplete  Urine Culture     Status: None   Collection Time: 11/02/21  9:55 PM   Specimen: Urine, Random  Result Value Ref Range Status   Specimen Description   Final    URINE, RANDOM Performed at [facility], [address], [place], [place] [address]    Special Requests   Final    NONE Performed at [facility], [address] [place], [place] [address]    Culture   Final    NO GROWTH Performed at [facility], [place] [address] [place], [place] [address]    Report Status 11/04/2021 FINAL  Final  Resp Panel by RT-PCR (Flu A&B, Covid) Nasopharyngeal Swab     Status: None   Collection Time: 11/02/21  9:55 PM   Specimen: Nasopharyngeal Swab; Nasopharyngeal(NP) swabs in vial transport medium  Result Value Ref Range Status   SARS Coronavirus 2 by RT PCR NEGATIVE NEGATIVE Final    Comment: (NOTE) SARS-CoV-2 target nucleic acids are NOT DETECTED.  The SARS-CoV-2 RNA is generally detectable in upper respiratory specimens during the acute phase of infection. The lowest concentration of SARS-CoV-2 viral copies this assay can detect is 138 copies/mL. A negative result does not preclude SARS-Cov-2 infection and should not be used as the sole basis for treatment or other patient management decisions. A negative result may occur with  improper specimen collection/handling, submission of specimen other than nasopharyngeal swab, presence of viral mutation(s) within the areas targeted by this assay, and inadequate number of viral copies(<138 copies/mL). A negative result must be combined with clinical observations, patient history, and epidemiological information. The expected result is Negative.  Fact Sheet for Patients:   EntrepreneurPulse.com.au  Fact Sheet for Healthcare Providers:  IncredibleEmployment.be  This test is no t yet approved or cleared by the [place] FDA and  has been authorized for detection and/or diagnosis of SARS-CoV-2 by FDA under an Emergency Use Authorization (EUA). This EUA will remain  in effect (meaning this test can be used) for the duration of the COVID-19 declaration under Section 564(b)(1) of the Act, 21 U.S.C.section 360bbb-3(b)(1), unless the authorization is terminated  or revoked sooner.       Influenza A by PCR NEGATIVE NEGATIVE Final   Influenza B by PCR NEGATIVE NEGATIVE Final    Comment: (NOTE) The Xpert Xpress SARS-CoV-2/FLU/RSV plus assay is intended as an aid in the diagnosis of influenza from Nasopharyngeal swab specimens and should not be used as a sole basis for treatment. Nasal washings and aspirates are unacceptable for Xpert Xpress SARS-CoV-2/FLU/RSV testing.  Fact Sheet for Patients: EntrepreneurPulse.com.au  Fact Sheet for Healthcare Providers: IncredibleEmployment.be  This test is not yet approved or cleared by the [place] FDA and has been authorized for detection and/or diagnosis of SARS-CoV-2 by FDA under an Emergency Use Authorization (EUA). This EUA will remain in effect (meaning this test can be used) for the duration of the COVID-19 declaration under Section 564(b)(1) of the Act, 21 U.S.C. section 360bbb-3(b)(1), unless the authorization is terminated or revoked.  Performed at [facility], [address] [place], [place] [address]   Culture, blood (Routine X 2) w Reflex to ID Panel     Status: None (Preliminary result)   Collection Time: 11/03/21 11:46 AM   Specimen: BLOOD  Result Value Ref Range Status   Specimen Description BLOOD RIGHT [facility]  Final   Special Requests   Final    BOTTLES DRAWN AEROBIC AND ANAEROBIC Blood Culture adequate  volume   Culture   Final    NO GROWTH 2 DAYS Performed at [facility], [address], [place], [place] [address]    Report Status PENDING  Incomplete     Labs: Basic Metabolic Panel: Recent Labs  Lab 11/02/21 2154 11/03/21 0401 11/04/21 0649 11/05/21 0536  NA 133* 136 139 141  K 4.4 4.4 4.1 3.6  CL 98  102 105 103  CO2 _0 GLUCOSE 122* 110* 93 88  BUN _1 CREATININE 1.25* 1.20* 1.09* 1.01*  CALCIUM 9.4 8.8* 8.9 9.0  MG  --   --  1.8 2.0   Liver Function Tests: Recent Labs  Lab 11/02/21 2155  AST 14*  ALT 15  ALKPHOS 68  BILITOT 0.9  PROT 7.0  ALBUMIN 3.6   Recent Labs  Lab 11/02/21 2155  LIPASE 22   No results for input(s): AMMONIA in the last 168 hours. CBC: Recent Labs  Lab 11/02/21 2154 11/03/21 0401 11/04/21 0649 11/05/21 0536  WBC 12.6* 10.7* 8.0 7.0  NEUTROABS  --   --  5.4  --   HGB 12.9 12.1 11.6* 11.2*  HCT 39.2 37.4 36.6 35.2*  MCV 87.3 89.7 88.8 89.8  PLT 217 201 223 234   Cardiac Enzymes: No results for input(s): CKTOTAL, CKMB, CKMBINDEX, TROPONINI in the last 168 hours. BNP: BNP (last 3 results) No results for input(s): BNP in the last 8760 hours.  ProBNP (last 3 results) No results for input(s): PROBNP in the last 8760 hours.  CBG: No results for input(s): GLUCAP in the last 168 hours.     Signed:  Irine Seal MD.  [facility] 11/05/2021, 12:34 PM

## 2021-11-07 ENCOUNTER — Telehealth: Payer: Self-pay

## 2021-11-07 LAB — CULTURE, BLOOD (ROUTINE X 2)
Culture: NO GROWTH
Special Requests: ADEQUATE

## 2021-11-07 NOTE — Telephone Encounter (Addendum)
Transition Care Management Unsuccessful Follow-up Telephone Call  Date of discharge and from where: TCM DC [facility] 11-05-21 Dx: Acute pyelonephritis   Attempts:  1st Attempt  Reason for unsuccessful TCM follow-up call:  Left voice message  Transition Care Management Follow-up Telephone Call Date of discharge and from where: TCM DC [facility] 11-05-21 Dx: Acute pyelonephritis How have you been since you were released from the hospital? Feeling better  Any questions or concerns? No  Items Reviewed: Did the pt receive and understand the discharge instructions provided? Yes  Medications obtained and verified? Yes  Other? No  Any new allergies since your discharge? No  Dietary orders reviewed? Yes Do you have support at home? Yes   Home Care and Equipment/Supplies: Were home health services ordered? no If so, what is the name of the agency?na   Has the agency set up a time to come to the patient's home? not applicable Were any new equipment or medical supplies ordered?  No What is the name of the medical supply agency? na Were you able to get the supplies/equipment? not applicable Do you have any questions related to the use of the equipment or supplies? No  Functional Questionnaire: (I = Independent and D = Dependent) ADLs: I  Bathing/Dressing- I  Meal Prep- I  Eating- I  Maintaining continence- I  Transferring/Ambulation- I  Managing Meds- I  Follow up appointments reviewed:  PCP Hospital f/u appt confirmed? Yes  [facility] No . Are transportation arrangements needed? No  If their condition worsens, is the pt aware to call PCP or go to the Emergency Dept.? Yes Was the patient provided with contact information for the PCP's office or ED? Yes Was to pt encouraged to call back with questions or concerns? Yes

## 2021-11-08 ENCOUNTER — Telehealth: Payer: Self-pay | Admitting: Family Medicine

## 2021-11-08 LAB — CULTURE, BLOOD (ROUTINE X 2)
Culture: NO GROWTH
Special Requests: ADEQUATE

## 2021-11-08 NOTE — Telephone Encounter (Signed)
Noted, will see patient and check labs

## 2021-11-08 NOTE — Telephone Encounter (Signed)
Pt wanted to know if something could be called in to help with her stomach issues since taking ciprofloxacin (CIPRO) 500 MG tablet

## 2021-11-17 ENCOUNTER — Ambulatory Visit (INDEPENDENT_AMBULATORY_CARE_PROVIDER_SITE_OTHER): Payer: 59 | Admitting: Family Medicine

## 2021-11-17 ENCOUNTER — Other Ambulatory Visit: Payer: Self-pay

## 2021-11-17 VITALS — BP 100/80 | HR 93 | Temp 98.3°F | Ht 62.0 in | Wt 179.2 lb

## 2021-11-17 DIAGNOSIS — E782 Mixed hyperlipidemia: Secondary | ICD-10-CM | POA: Diagnosis not present

## 2021-11-17 DIAGNOSIS — D649 Anemia, unspecified: Secondary | ICD-10-CM

## 2021-11-17 DIAGNOSIS — E039 Hypothyroidism, unspecified: Secondary | ICD-10-CM | POA: Diagnosis not present

## 2021-11-17 DIAGNOSIS — I10 Essential (primary) hypertension: Secondary | ICD-10-CM | POA: Diagnosis not present

## 2021-11-17 DIAGNOSIS — K5909 Other constipation: Secondary | ICD-10-CM

## 2021-11-17 DIAGNOSIS — Z1159 Encounter for screening for other viral diseases: Secondary | ICD-10-CM

## 2021-11-17 LAB — LIPID PANEL
Cholesterol: 168 mg/dL (ref 0–200)
HDL: 52.2 mg/dL (ref >39.00)
LDL Cholesterol: 101 mg/dL — ABNORMAL HIGH (ref 0–99)
NonHDL: 115.34
Total CHOL/HDL Ratio: 3
Triglycerides: 74 mg/dL (ref 0.0–149.0)
VLDL: 14.8 mg/dL (ref 0.0–40.0)

## 2021-11-17 LAB — COMPREHENSIVE METABOLIC PANEL
ALT: 20 U/L (ref 0–35)
AST: 19 U/L (ref 0–37)
Albumin: 4.2 g/dL (ref 3.5–5.2)
Alkaline Phosphatase: 62 U/L (ref 39–117)
BUN: 13 mg/dL (ref 6–23)
CO2: 32 mEq/L (ref 19–32)
Calcium: 9.8 mg/dL (ref 8.4–10.5)
Chloride: 103 mEq/L (ref 96–112)
Creatinine, Ser: 1.14 mg/dL (ref 0.40–1.20)
GFR: 54.33 mL/min — ABNORMAL LOW (ref >60.00)
Glucose, Bld: 93 mg/dL (ref 70–99)
Potassium: 5 mEq/L (ref 3.5–5.1)
Sodium: 140 mEq/L (ref 135–145)
Total Bilirubin: 0.4 mg/dL (ref 0.2–1.2)
Total Protein: 6.6 g/dL (ref 6.0–8.3)

## 2021-11-17 LAB — CBC
HCT: 39.1 % (ref 36.0–46.0)
Hemoglobin: 12.9 g/dL (ref 12.0–15.0)
MCHC: 32.9 g/dL (ref 30.0–36.0)
MCV: 88.1 fl (ref 78.0–100.0)
Platelets: 325 10*3/uL (ref 150.0–400.0)
RBC: 4.44 Mil/uL (ref 3.87–5.11)
RDW: 14.3 % (ref 11.5–15.5)
WBC: 6.4 10*3/uL (ref 4.0–10.5)

## 2021-11-17 LAB — TSH: TSH: 4 u[IU]/mL (ref 0.35–5.50)

## 2021-11-17 LAB — FERRITIN: Ferritin: 160.3 ng/mL (ref 10.0–291.0)

## 2021-11-17 LAB — MAGNESIUM: Magnesium: 2.4 mg/dL (ref 1.5–2.5)

## 2021-11-17 NOTE — Assessment & Plan Note (Addendum)
Discussed adding Colace to her bowel regimen including magnesium.  OK for as needed senna.  Given some change in stool consistency and patient with previous polyps on colonoscopy advised checking in with GI about closer follow-up for colon cancer screening.  Pathology not available, but colonoscopy report from 2018 said 5 to 10 years.  Appreciate GI support  ?

## 2021-11-17 NOTE — Assessment & Plan Note (Signed)
Continue atorvastatin 10 mg repeat labs today. ?

## 2021-11-17 NOTE — Progress Notes (Signed)
? ?Subjective:  ? ?  ?Renee Mcdaniel is a 56 y.o. female presenting for Hospitalization Follow-up ?  ? ? ?HPI ? ?#fatigue ?- still feeling pretty tired ?- just feels tired during the day ?- noticed the anemia on her labs at the hospital ?- pain has resolved ?- normal urination  ? ?#Stomach issues ?- has had constipation for years ?- has been magnesium and benefiber w/ some help ?- but still having some constipation ?- also having belching at night and nausea ?- will take nexium occasionally for burning but not the nausea ?- completed course of abx and thinks that may have worsened things ?- has been doing senna and enemas  ?- bloated pain ?- no localized pain ?- has noticed a change in her bowel ?- did have colonoscopy 2018 ?- no blood in stool or urine ? ?Regular regimen ?- 1000 mg Magnesium ?-  ? ?Review of Systems ? ?2/15-2/18: Admission - pyelonephritis c/b AKI ?- needs electrolytes and blood counts (anemia) ? ?Social History  ? ?Tobacco Use  ?Smoking Status Never  ?Smokeless Tobacco Never  ? ? ? ?   ?Objective:  ?  ?BP Readings from Last 3 Encounters:  ?11/17/21 100/80  ?11/05/21 112/71  ?10/31/21 116/76  ? ?Wt Readings from Last 3 Encounters:  ?11/17/21 179 lb 4 oz (81.3 kg)  ?11/02/21 180 lb (81.6 kg)  ?10/31/21 182 lb (82.6 kg)  ? ? ?BP 100/80   Pulse 93   Temp 98.3 ?F (36.8 ?C) (Oral)   Ht 5\' 2"  (1.575 m)   Wt 179 lb 4 oz (81.3 kg)   LMP 04/19/2011   SpO2 97%   BMI 32.79 kg/m?  ? ? ?Physical Exam ?Constitutional:   ?   General: She is not in acute distress. ?   Appearance: She is well-developed. She is not diaphoretic.  ?HENT:  ?   Right Ear: External ear normal.  ?   Left Ear: External ear normal.  ?Eyes:  ?   Conjunctiva/sclera: Conjunctivae normal.  ?Cardiovascular:  ?   Rate and Rhythm: Normal rate and regular rhythm.  ?   Heart sounds: No murmur heard. ?Pulmonary:  ?   Effort: Pulmonary effort is normal. No respiratory distress.  ?   Breath sounds: Normal breath sounds. No wheezing.   ?Abdominal:  ?   General: Abdomen is flat. Bowel sounds are normal. There is no distension.  ?   Palpations: Abdomen is soft.  ?   Tenderness: There is no abdominal tenderness. There is no guarding or rebound.  ?Musculoskeletal:  ?   Cervical back: Neck supple.  ?Skin: ?   General: Skin is warm and dry.  ?   Capillary Refill: Capillary refill takes less than 2 seconds.  ?Neurological:  ?   Mental Status: She is alert. Mental status is at baseline.  ?Psychiatric:     ?   Mood and Affect: Mood normal.     ?   Behavior: Behavior normal.  ? ? ? ? ? ?   ?Assessment & Plan:  ? ?Problem List Items Addressed This Visit   ? ?  ? Cardiovascular and Mediastinum  ? HTN (hypertension)  ?  Blood pressure at goal.  Not currently taking any medications. ?  ?  ? Relevant Orders  ? Comprehensive metabolic panel  ?  ? Digestive  ? Chronic constipation  ?  Discussed adding Colace to her bowel regimen including magnesium.  OK for as needed senna.  Given some change in stool  consistency and patient with previous polyps on colonoscopy advised checking in with GI about closer follow-up for colon cancer screening.  Pathology not available, but colonoscopy report from 2018 said 5 to 10 years.  Appreciate GI support  ?  ?  ? Relevant Orders  ? Comprehensive metabolic panel  ? TSH  ? Magnesium  ? Ambulatory referral to Gastroenterology  ?  ? Endocrine  ? Hypothyroidism  ?  In setting of worsening constipation we will recheck TSH.  Continue levothyroxine 75 mcg. ?  ?  ? Relevant Orders  ? TSH  ?  ? Other  ? Hyperlipidemia - Primary  ?  Continue atorvastatin 10 mg repeat labs today. ?  ?  ? Relevant Orders  ? Comprehensive metabolic panel  ? Lipid panel  ? Anemia  ?  Mild anemia noted on most recent hospitalization, she notes some persisting fatigue this could be just secondary to recovering from illness.  However we will get repeat blood counts as well as iron to rule out iron deficiency. ?  ?  ? Relevant Orders  ? CBC  ? Ferritin  ? ?Other  Visit Diagnoses   ? ? Need for hepatitis C screening test      ? Relevant Orders  ? Hepatitis C antibody  ? ?  ? ? ? ?Return if symptoms worsen or fail to improve. ? ?Lesleigh Noe, MD ? ?This visit occurred during the SARS-CoV-2 public health emergency.  Safety protocols were in place, including screening questions prior to the visit, additional usage of staff PPE, and extensive cleaning of exam room while observing appropriate contact time as indicated for disinfecting solutions.  ? ?

## 2021-11-17 NOTE — Patient Instructions (Signed)
Stomach issues ?- Take Nexium daily for 2 weeks ?- if improved, continue for another 4-6 weeks before going to as needed ?- belching, burning, nausea ? ?Constipation  ? ?Constipation is a common issue. Often it is related to diet and occasionally medications.  ? ? ?What you can do to treat your symptoms ?1) Fiber ?-- Eat more fiber rich foods: beans, broccoli, berries, avocados, popcorn, pear/apple, green peas, turnip greens, brussels sprouts, whole grains (barley, bran, quinoa, oatmeal) ?-- Take a Fiber supplement: Psyllium (Metamucil)  ?-- Could also eat Prunes daily ? ?2) Hydration  ?-- Drink more water: Try to drink 64 oz of water per day ? ?3) Exercise ?-- Moderate exercise (walking, jogging, biking) for 30 minutes, 5 days a week ? ?4) Dedicate time for Bowel movements - do not delay ? ?5) Stool Softener  ?- Docusate Sodium (Colace) 100 mg daily or twice daily as needed ? ? ?If 4-6 weeks have passed and the above has not helped then start the following ?6) Laxatives ?-- Polyethylene Glycol (Miralax) - begin with once daily. After a few days can increase to twice daily ?Or ?-- Magnesium Citrate -- Common side effect is nausea and diarrhea -- can try if still not improved ? ? ?Treating chronic constipation is often about finding the right amount of medication and fiber to keep you regular and comfortable. For some people that may be daily metamucil and colace every other day. For others it may be Metamucil and colace twice daily and Miralax 3 times a week. The goal is to go slow and listen to your body. And normal can be anywhere from 2-3 soft bowel movements a day to 1 bowel movement every 2-3 days.  ? ?

## 2021-11-17 NOTE — Assessment & Plan Note (Signed)
In setting of worsening constipation we will recheck TSH.  Continue levothyroxine 75 mcg. ?

## 2021-11-17 NOTE — Assessment & Plan Note (Signed)
Blood pressure at goal.  Not currently taking any medications. ?

## 2021-11-17 NOTE — Assessment & Plan Note (Signed)
Mild anemia noted on most recent hospitalization, she notes some persisting fatigue this could be just secondary to recovering from illness.  However we will get repeat blood counts as well as iron to rule out iron deficiency. ?

## 2021-11-18 LAB — HEPATITIS C ANTIBODY
Hepatitis C Ab: NONREACTIVE
SIGNAL TO CUT-OFF: <0.02 (ref <1.00)

## 2021-11-24 ENCOUNTER — Encounter: Payer: Self-pay | Admitting: *Deleted

## 2021-11-28 ENCOUNTER — Ambulatory Visit: Payer: 59

## 2021-11-29 ENCOUNTER — Ambulatory Visit
Admission: RE | Admit: 2021-11-29 | Discharge: 2021-11-29 | Disposition: A | Discharge status: Home / Self Care | Payer: 59 | Source: Ambulatory Visit | Attending: Obstetrics and Gynecology | Admitting: Obstetrics and Gynecology

## 2021-11-29 DIAGNOSIS — Z1231 Encounter for screening mammogram for malignant neoplasm of breast: Secondary | ICD-10-CM

## 2021-12-25 IMAGING — MG MM DIGITAL SCREENING BILAT W/ TOMO AND CAD
6 of 10 series · 6 of 30 positions shown · non-contrast
Comparison: Previous exam(s).

CLINICAL DATA: Screening.

EXAM:
DIGITAL SCREENING BILATERAL MAMMOGRAM WITH TOMOSYNTHESIS AND CAD
TECHNIQUE: Bilateral screening digital craniocaudal and mediolateral oblique
mammograms were obtained. Bilateral screening digital breast
tomosynthesis was performed. The images were evaluated with
computer-aided detection.

[R XCCL synth-2D]
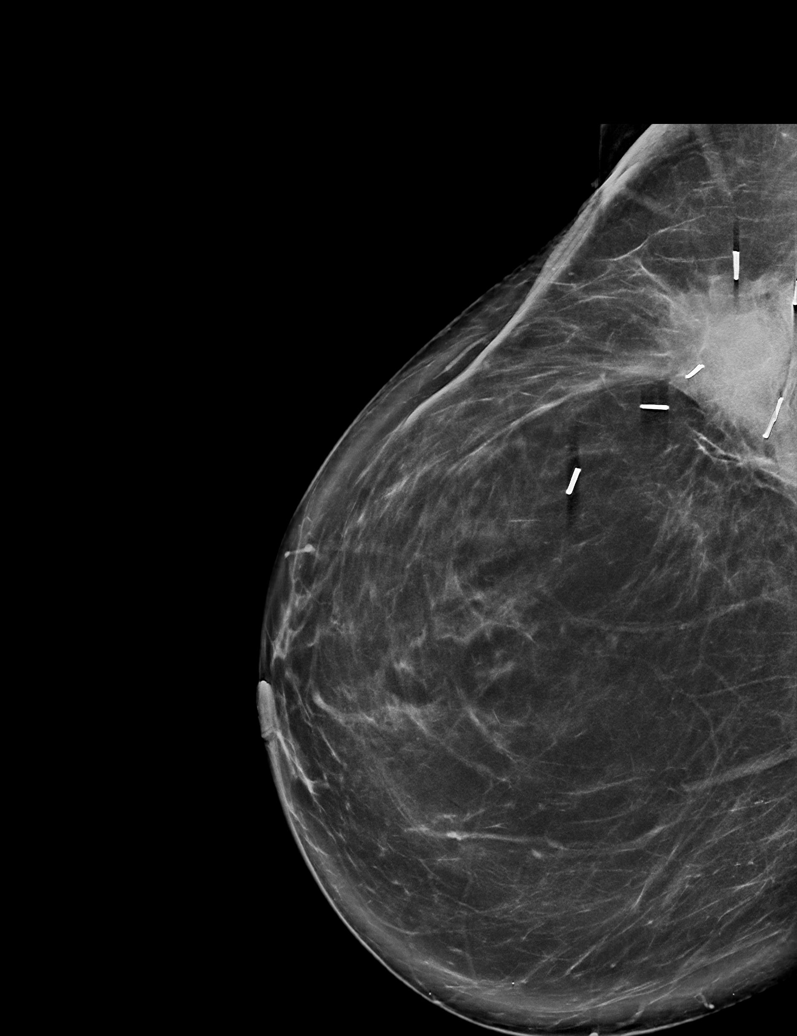

[R MLO synth-2D]
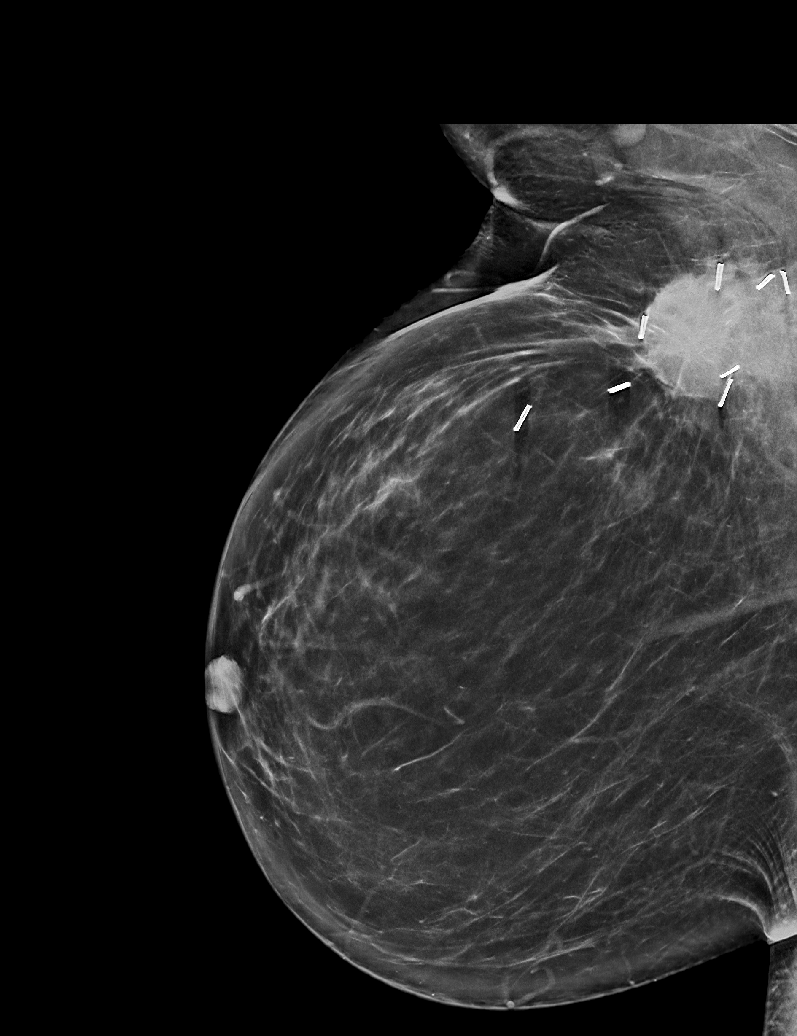

[L MLO synth-2D]
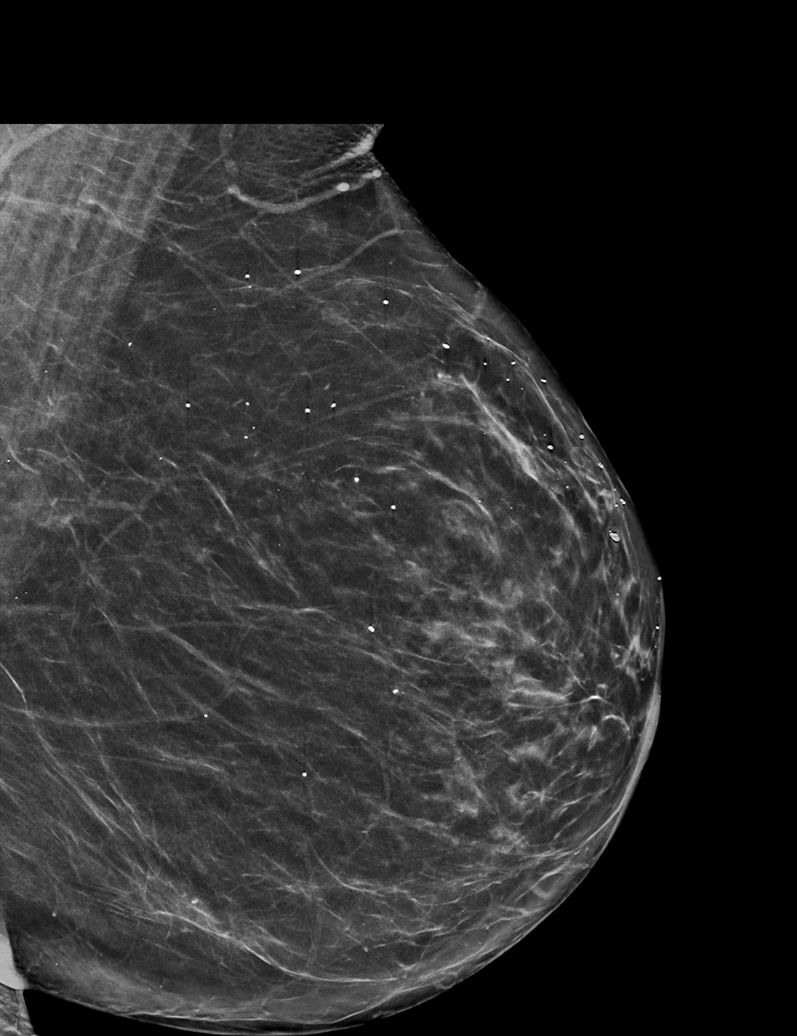

[R CC synth-2D]
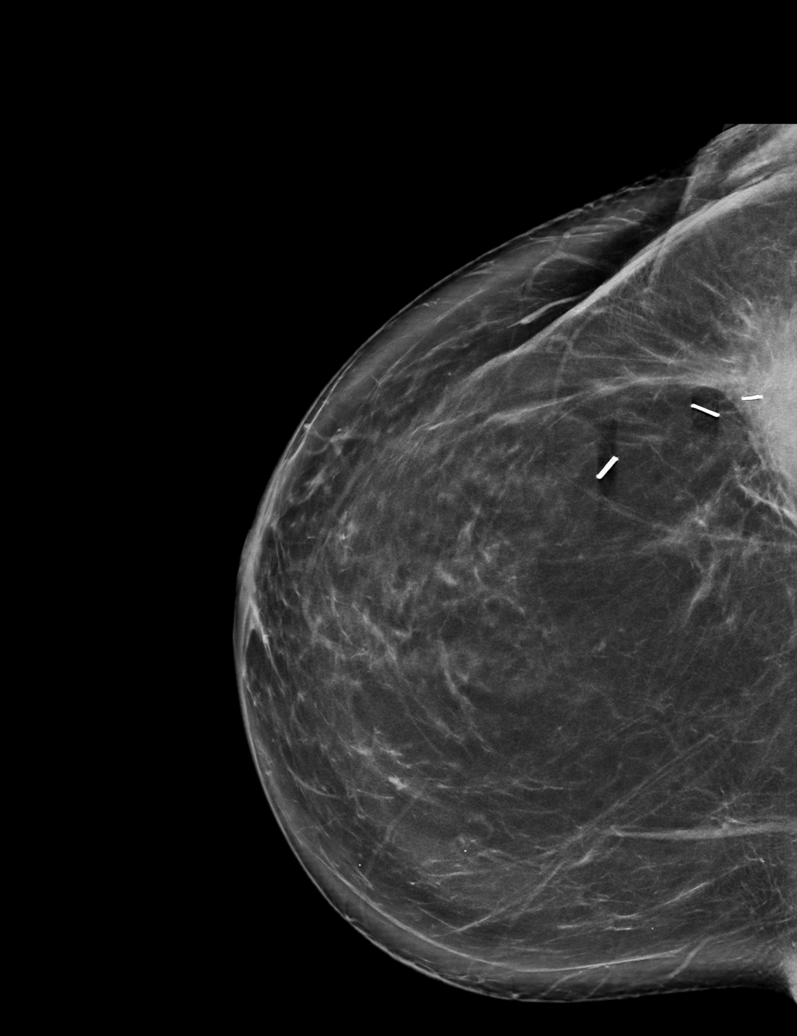

[L CC synth-2D]
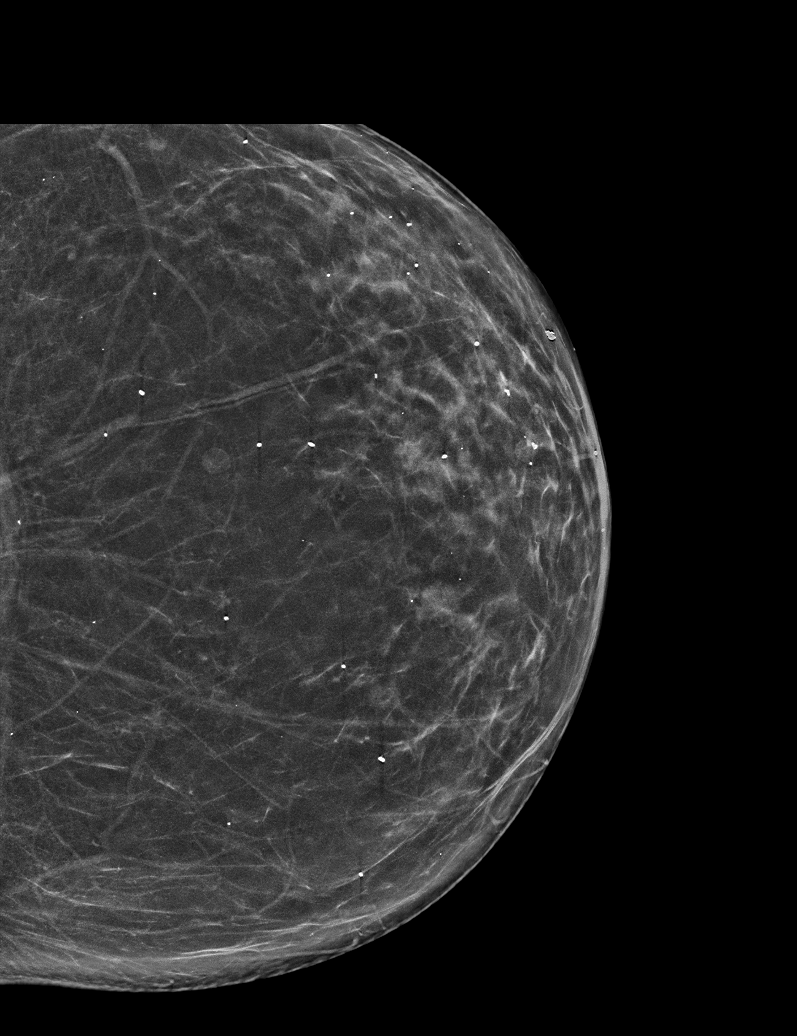

[R MLO tomo · tomo slice 37/74.0]
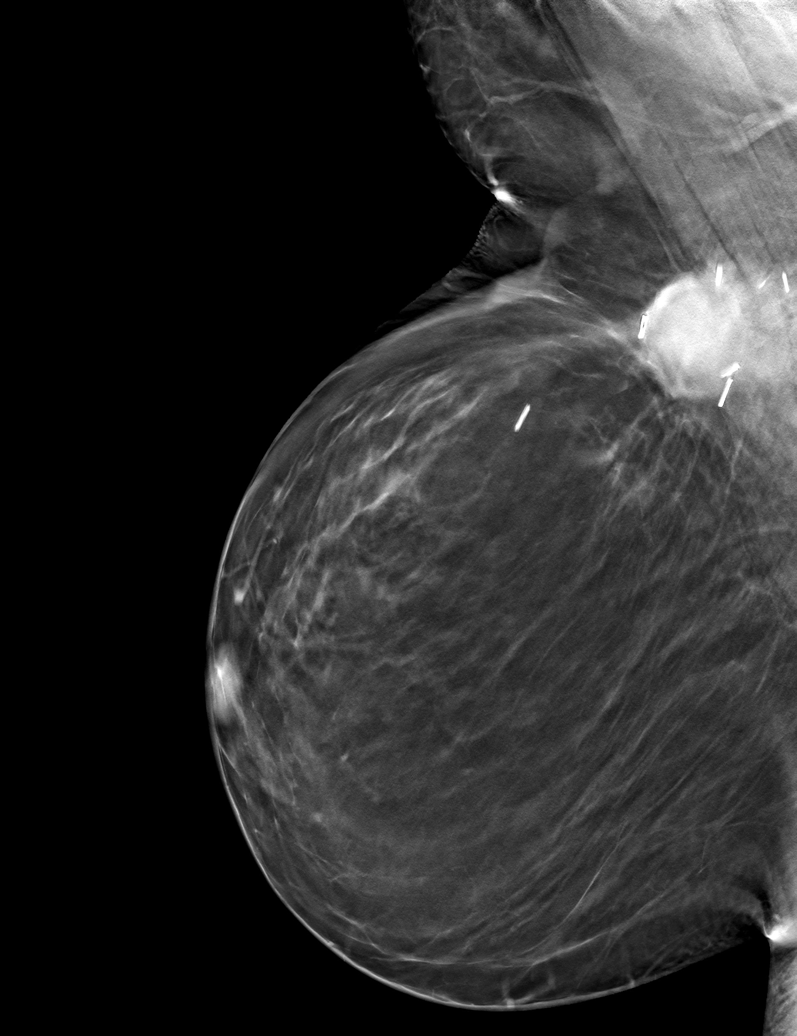

[6 of 30 positions shown; findings below may reference images not displayed]

ACR Breast Density Category b: There are scattered areas of
fibroglandular density.
FINDINGS: There are no findings suspicious for malignancy. The images were
evaluated with computer-aided detection.
IMPRESSION: No mammographic evidence of malignancy. A result letter of this
screening mammogram will be mailed directly to the patient.

RECOMMENDATION:
Screening mammogram in one year. (Code:WJ-[id number])

BI-RADS CATEGORY  1: Negative.

## 2022-02-23 ENCOUNTER — Other Ambulatory Visit: Payer: Self-pay | Admitting: Family Medicine

## 2022-02-23 ENCOUNTER — Other Ambulatory Visit (INDEPENDENT_AMBULATORY_CARE_PROVIDER_SITE_OTHER): Payer: 59

## 2022-02-23 DIAGNOSIS — I10 Essential (primary) hypertension: Secondary | ICD-10-CM | POA: Diagnosis not present

## 2022-02-23 LAB — BASIC METABOLIC PANEL
BUN: 14 mg/dL (ref 6–23)
CO2: 31 mEq/L (ref 19–32)
Calcium: 9.7 mg/dL (ref 8.4–10.5)
Chloride: 104 mEq/L (ref 96–112)
Creatinine, Ser: 1.08 mg/dL (ref 0.40–1.20)
GFR: 57.87 mL/min — ABNORMAL LOW (ref >60.00)
Glucose, Bld: 85 mg/dL (ref 70–99)
Potassium: 4 mEq/L (ref 3.5–5.1)
Sodium: 140 mEq/L (ref 135–145)

## 2022-05-19 ENCOUNTER — Other Ambulatory Visit (HOSPITAL_COMMUNITY): Payer: Self-pay | Admitting: Gastroenterology

## 2022-05-19 ENCOUNTER — Other Ambulatory Visit: Payer: Self-pay | Admitting: Gastroenterology

## 2022-05-19 DIAGNOSIS — R1033 Periumbilical pain: Secondary | ICD-10-CM

## 2022-05-30 ENCOUNTER — Other Ambulatory Visit (HOSPITAL_COMMUNITY): Payer: 59

## 2022-05-30 ENCOUNTER — Ambulatory Visit
Admission: RE | Admit: 2022-05-30 | Discharge: 2022-05-30 | Disposition: A | Discharge status: Home / Self Care | Payer: 59 | Source: Ambulatory Visit | Attending: Gastroenterology | Admitting: Gastroenterology

## 2022-05-30 DIAGNOSIS — R1033 Periumbilical pain: Secondary | ICD-10-CM | POA: Insufficient documentation

## 2022-05-30 MED ORDER — IOHEXOL 300 MG/ML  SOLN
100.0000 mL | Freq: Once | INTRAMUSCULAR | Status: AC | PRN
  Administered 2022-05-30: 100 mL via INTRAVENOUS

## 2022-07-06 ENCOUNTER — Ambulatory Visit (INDEPENDENT_AMBULATORY_CARE_PROVIDER_SITE_OTHER): Payer: PRIVATE HEALTH INSURANCE

## 2022-07-06 DIAGNOSIS — Z23 Encounter for immunization: Secondary | ICD-10-CM

## 2022-09-19 ENCOUNTER — Other Ambulatory Visit: Payer: Self-pay | Admitting: Obstetrics and Gynecology

## 2022-09-19 DIAGNOSIS — Z1231 Encounter for screening mammogram for malignant neoplasm of breast: Secondary | ICD-10-CM

## 2022-12-01 ENCOUNTER — Ambulatory Visit
Admission: RE | Admit: 2022-12-01 | Discharge: 2022-12-01 | Disposition: A | Discharge status: Home / Self Care | Payer: 59 | Source: Ambulatory Visit | Attending: Obstetrics and Gynecology | Admitting: Obstetrics and Gynecology

## 2022-12-01 DIAGNOSIS — Z1231 Encounter for screening mammogram for malignant neoplasm of breast: Secondary | ICD-10-CM

## 2023-11-21 ENCOUNTER — Other Ambulatory Visit: Payer: Self-pay | Admitting: Obstetrics and Gynecology

## 2023-11-21 DIAGNOSIS — Z1231 Encounter for screening mammogram for malignant neoplasm of breast: Secondary | ICD-10-CM

## 2023-12-03 ENCOUNTER — Ambulatory Visit
Admission: RE | Admit: 2023-12-03 | Discharge: 2023-12-03 | Disposition: A | Discharge status: Home / Self Care | Payer: PRIVATE HEALTH INSURANCE | Source: Ambulatory Visit | Attending: Obstetrics and Gynecology | Admitting: Obstetrics and Gynecology

## 2023-12-03 DIAGNOSIS — Z1231 Encounter for screening mammogram for malignant neoplasm of breast: Secondary | ICD-10-CM

## 2023-12-03 HISTORY — DX: Personal history of antineoplastic chemotherapy: Z92.21

## 2023-12-06 ENCOUNTER — Other Ambulatory Visit: Payer: Self-pay | Admitting: Obstetrics and Gynecology

## 2023-12-06 DIAGNOSIS — R928 Other abnormal and inconclusive findings on diagnostic imaging of breast: Secondary | ICD-10-CM

## 2023-12-10 ENCOUNTER — Ambulatory Visit
Admission: RE | Admit: 2023-12-10 | Discharge: 2023-12-10 | Disposition: A | Discharge status: Home / Self Care | Payer: PRIVATE HEALTH INSURANCE | Source: Ambulatory Visit | Attending: Obstetrics and Gynecology | Admitting: Obstetrics and Gynecology

## 2023-12-10 ENCOUNTER — Ambulatory Visit: Payer: PRIVATE HEALTH INSURANCE

## 2023-12-10 DIAGNOSIS — R928 Other abnormal and inconclusive findings on diagnostic imaging of breast: Secondary | ICD-10-CM

## 2023-12-19 ENCOUNTER — Other Ambulatory Visit: Payer: PRIVATE HEALTH INSURANCE
